# Patient Record
Sex: Female | Born: 2000 | Race: White | Hispanic: No
Health system: Southern US, Community
[De-identification: ages and names within clinical notes are randomized; demographics above are authoritative.]

## PROBLEM LIST (undated history)

## (undated) DIAGNOSIS — F419 Anxiety disorder, unspecified: Secondary | ICD-10-CM

## (undated) DIAGNOSIS — R519 Headache, unspecified: Secondary | ICD-10-CM

## (undated) DIAGNOSIS — M419 Scoliosis, unspecified: Secondary | ICD-10-CM

## (undated) DIAGNOSIS — IMO0001 Reserved for inherently not codable concepts without codable children: Secondary | ICD-10-CM

## (undated) DIAGNOSIS — F32A Depression, unspecified: Secondary | ICD-10-CM

## (undated) DIAGNOSIS — E039 Hypothyroidism, unspecified: Secondary | ICD-10-CM

## (undated) HISTORY — PX: OTHER SURGICAL HISTORY: SHX169

---
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
## 2021-01-01 ENCOUNTER — Telehealth: Payer: Self-pay | Admitting: Family Medicine

## 2021-01-01 LAB — URINE CYTOLOGY ANCILLARY ONLY
Chlamydia: NEGATIVE
Comment: NEGATIVE
Comment: NORMAL
Neisseria Gonorrhea: NEGATIVE

## 2021-01-01 NOTE — Telephone Encounter (Signed)
Refer to lab results.  

## 2021-01-01 NOTE — Telephone Encounter (Signed)
Pt calling back about test results. Please call back.

## 2021-01-02 ENCOUNTER — Other Ambulatory Visit: Payer: Self-pay

## 2021-01-02 ENCOUNTER — Encounter: Payer: Self-pay | Admitting: Nurse Practitioner

## 2021-01-02 ENCOUNTER — Ambulatory Visit (INDEPENDENT_AMBULATORY_CARE_PROVIDER_SITE_OTHER): Payer: Medicaid Other | Admitting: Nurse Practitioner

## 2021-01-02 VITALS — BP 117/74 | HR 88 | Temp 97.9°F | Resp 20 | Ht 65.0 in | Wt 153.0 lb

## 2021-01-02 DIAGNOSIS — T2057XA Corrosion of first degree of neck, initial encounter: Secondary | ICD-10-CM | POA: Diagnosis not present

## 2021-01-02 DIAGNOSIS — R21 Rash and other nonspecific skin eruption: Secondary | ICD-10-CM | POA: Diagnosis not present

## 2021-01-02 MED ORDER — METHYLPREDNISOLONE ACETATE 80 MG/ML IJ SUSP
80.0000 mg | Freq: Once | INTRAMUSCULAR | Status: AC
  Administered 2021-01-02: 80 mg via INTRAMUSCULAR

## 2021-01-02 NOTE — Progress Notes (Signed)
   Subjective:    Patient ID: Jennifer Shaw, female    DOB: [birth year], 20 y.o.   MRN: [id number]   Chief Complaint: ? chemical burn to neck and face (Cleaning at work last night and neck got very red)   HPI Patient is in today c/o chemical burn. 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 She touched her neck while cleaning. Woke up in the middle of night with rash  on neck form the cleaning liquid. It burns and itches. She put neosporin which did not help. Trying  not to touch it.    Review of Systems  Constitutional: Negative.   Respiratory: Negative.    Cardiovascular: Negative.   Genitourinary: Negative.   Skin:  Positive for rash.      Objective:   Physical Exam Vitals and nursing note reviewed.  Constitutional:      Appearance: Normal appearance.  Cardiovascular:     Rate and Rhythm: Normal rate and regular rhythm.     Heart sounds: Normal heart sounds.  Pulmonary:     Effort: Pulmonary effort is normal.     Breath sounds: Normal breath sounds.  Skin:    General: Skin is warm.     Findings: Rash (erytherymatous  rash n neck and lower jaw line.) present.  Neurological:     Mental Status: She is alert.    BP 117/74   Pulse 88   Temp 97.9 F (36.6 C) (Temporal)   Resp 20   Ht 5\' 5"  (1.651 m)   Wt 153 lb (69.4 kg)   SpO2 100%   BMI 25.46 kg/m        Assessment & Plan:  Jennifer Shaw in today with chief complaint of ? chemical burn to neck and face (Cleaning at work last night and neck got very red)   1. Superficial chemical burn of neck, initial encounter Avoid rubbing area Cool compresses RTO prn - methylPREDNISolone acetate (DEPO-MEDROL) injection 80 mg    The above assessment and management plan was discussed with the patient. The patient verbalized understanding of and has agreed to the management plan. Patient is aware to call the clinic if symptoms persist or worsen. Patient is aware when to return to the clinic for a follow-up visit. Patient  educated on when it is appropriate to go to the emergency department.   Mary-Margaret , FNP

## 2021-01-02 NOTE — Patient Instructions (Signed)
Chemical Burn, Adult A chemical burn is an injury to the skin. The structures below the skin can also be injured, such as the lungs and other internal organs. This type of burn is caused by coming into contact with a chemical that can damage and kill tissue (caustic chemical). Common caustic chemicals are found in fertilizers, household cleaners, and drain cleaners. A chemical burn can be more serious than other burns. Some chemicals continue to cause damage even after they have been removed from the skin. What are the causes? This condition is caused by swallowing, breathing in, touching, or being touched by a caustic chemical. What increases the risk? You are more likely to get a chemical burn if you work in a place where chemicals are made, stored, or used. These include working in [employer], educational, [place], farming, and mining. What are the signs or symptoms? Symptoms of this condition depend on the type of chemical that caused the burn and the way the chemical came in contact with the body. Symptoms may continue to get worse even after the chemical has been removed. Common symptoms include: Color changes of the skin. Your skin may lose color (blanch), turn red, or turn darker. Blistered skin. Rash. Dry, flaky skin. A type of acne (chloracne) that results from exposure to certain chemicals. Burning or aching pain. Itching. If the chemical is breathed in, or inhaled, symptoms include eye or nose irritation, sore throat, or coughing. If the chemical is swallowed (ingested) or absorbed into the body through a wound, it can damage: Organs, including the liver, kidneys, and bladder. The immune system, the nervous system, or the nose, throat, windpipe, and lungs (respiratory system). Later symptoms may include scarring, shrinking of the skin, and permanent change in skin color. How is this diagnosed? This condition is diagnosed with a medical history and physical exam. Your health care provider  will check how deep the burn is and how much of your skin surface it covers. You may be diagnosed with a: First-degree burn, if the burn only affects the outer layer of skin (epidermis). Second-degree burn, if the burn extends into the second layer of skin (dermis). Third-degree burn, if the burn extends through the dermis and into deeper tissue (hypodermis). Your blood pressure, heart rate, and urine output may also be measured. If the injury is severe, you may also have: Blood tests. A test to check the heart's electrical activity (electrocardiogram, or ECG). A chest X-ray. How is this treated? This condition may be treated by removing the caustic chemical. The skin will be washed or brushed to remove the chemical. Clothes will be removed if they have the chemical on them. After the chemical is removed, you may receive: Oxygen to help you breathe. Antibiotic medicine to fight infection. Pain medicine. Fluids through an IV. Bandages (dressings). A procedure to remove dead tissue (debridement). A tetanus shot. Long-term burn care may include: Breathing support. You may be given oxygen using a machine (ventilator). Frequent wound dressing changes. Antibiotics. Surgery, including debridement, skin graft, or repairing of damaged tissue or structures. Physical therapy. Follow these instructions at home: Medicines Take and apply over-the-counter and prescription medicines only as told by your health care provider. If you were prescribed an antibiotic medicine, take or apply it as told by your health care provider. Do not stop using the antibiotic even if you start to feel better. Burn care Follow instructions from your health care provider about how to take care of your burn, including: How to clean your  burn. When and how you should remove or change your dressing. Check your burn every day for signs of infection. Check for: More redness, swelling, or pain. Fluid or blood. Warmth. Pus  or a bad smell. Keep the dressing dry until your health care provider says it can be removed. Do not take baths, swim, use a hot tub, or do anything that would put your burn underwater until your health care provider approves. Ask your health care provider if you may take showers. You may only be allowed to take sponge baths. Do not put ice on your burn. This can cause more damage. Activity Rest as told by your health care provider. Do not exercise until your health care provider approves. Do range-of-motion movements, if told by your health care provider. General instructions Raise (elevate) the injured area above the level of your heart while you are sitting or lying down. Do not scratch or pick at the burn. Do not break any blisters you may have. Do not peel any skin. Protect your burn from the sun. Drink enough fluid to keep your urine pale yellow. Do not put butter, oil, or other home remedies on your burn. This can lead to an infection. Do not use any products that contain nicotine or tobacco, such as cigarettes, e-cigarettes, and chewing tobacco. These can delay healing. If you need help quitting, ask your health care provider. Keep all follow-up visits as told by your health care provider. This is important. How is this prevented? Avoid exposure to caustic chemicals. Wear protective gloves and equipment when you handle caustic chemicals. Make sure all caustic chemicals are labeled. Talk to your employer about the caustic chemicals that they use. Ask whether those can be replaced with chemicals that are less harmful. Make sure there is proper airflow (ventilation) in any area with caustic chemicals. Keep your skin clean and moisturized. Dry skin is more likely to be damaged by chemicals. Contact a health care provider if: You received a tetanus shot and you have any of the following symptoms at the injection site: Swelling. Severe pain. Redness. Bleeding. Your symptoms do not  improve with treatment. Your pain is not controlled with medicine. You have more redness, swelling, or pain around your burn. Your burn feels warm to the touch. Get help right away if you: Develop any signs of infection such as: Red streaks near the burn. Fluid, blood, or pus coming from the burn. A bad smell coming from your burn. Develop severe swelling. Develop severe pain. Have a fever. Have numbness or tingling in the burned area or farther down your legs or arms. Have trouble breathing, or you develop coughing or noisy breathing (wheezing). Have chest pain. Summary A chemical burn is an injury to the skin that is caused by a caustic chemical. Some chemicals continue to cause damage even after they have been removed from the skin. This condition is more likely to develop in people who are exposed to chemicals at work. Avoid exposure to caustic chemicals that can cause burns. Wear protective gloves and equipment when you handle dangerous chemicals. This information is not intended to replace advice given to you by your health care provider. Make sure you discuss any questions you have with your health care provider. Document Revised: 07/14/2018 Document Reviewed: 07/14/2018 Elsevier Patient Education  2022 [employer].

## 2021-01-09 DIAGNOSIS — F419 Anxiety disorder, unspecified: Secondary | ICD-10-CM | POA: Diagnosis not present

## 2021-01-13 DIAGNOSIS — K529 Noninfective gastroenteritis and colitis, unspecified: Secondary | ICD-10-CM | POA: Diagnosis not present

## 2021-01-20 ENCOUNTER — Other Ambulatory Visit: Payer: Self-pay | Admitting: Family Medicine

## 2021-01-22 ENCOUNTER — Institutional Professional Consult (permissible substitution): Payer: Medicaid Other | Admitting: Plastic Surgery

## 2021-01-25 ENCOUNTER — Encounter: Payer: Self-pay | Admitting: Plastic Surgery

## 2021-01-25 ENCOUNTER — Ambulatory Visit (INDEPENDENT_AMBULATORY_CARE_PROVIDER_SITE_OTHER): Payer: Medicaid Other | Admitting: Plastic Surgery

## 2021-01-25 ENCOUNTER — Other Ambulatory Visit: Payer: Self-pay

## 2021-01-25 VITALS — BP 116/81 | HR 74 | Ht 65.0 in | Wt 150.2 lb

## 2021-01-25 DIAGNOSIS — M546 Pain in thoracic spine: Secondary | ICD-10-CM | POA: Diagnosis not present

## 2021-01-25 DIAGNOSIS — F330 Major depressive disorder, recurrent, mild: Secondary | ICD-10-CM | POA: Diagnosis not present

## 2021-01-25 DIAGNOSIS — N62 Hypertrophy of breast: Secondary | ICD-10-CM

## 2021-01-25 DIAGNOSIS — G8929 Other chronic pain: Secondary | ICD-10-CM | POA: Diagnosis not present

## 2021-01-27 NOTE — Progress Notes (Signed)
° °  Referring Provider Gabriel Earing, FNP [address] [place],  [place] [address]   CC:  Breast hypertrophy and back pain   Jennifer Shaw is an 20 y.o. female.  HPI: 20 year old who is interested in breast reduction.  She is a DD,  She desires B cup.  She has back and shoulder paine and uses butt paste for her intermammary fold rashes.  She has not had mammograms due to age.   No Known Allergies  Outpatient Encounter Medications as of 01/25/2021  Medication Sig   desvenlafaxine (PRISTIQ) 50 MG 24 hr tablet Take 1 tablet by mouth daily.   fluticasone (FLONASE) 50 MCG/ACT nasal spray Place 2 sprays into both nostrils daily.   hydrOXYzine (ATARAX/VISTARIL) 25 MG tablet Take 1-2 tablets 3x a day as needed for anxiety.   levothyroxine (SYNTHROID) 75 MCG tablet TAKE 1 TABLET BY MOUTH EVERY DAY   No facility-administered encounter medications on file as of 01/25/2021.     Past Medical History:  Diagnosis Date   Dysplasia    Scoliosis     Past Surgical History:  Procedure Laterality Date   reconstructive surgery      Family History  Problem Relation Age of Onset   Depression Mother    Alcohol abuse Father    Depression Sister    ADD / ADHD Maternal Grandmother    Anxiety disorder Maternal Grandmother    Cancer Maternal Grandmother        breast cancer   Depression Maternal Grandmother    Alcohol abuse Maternal Grandfather    Cancer Maternal Grandfather        lung cancer   COPD Maternal Grandfather    Alcohol abuse Paternal Grandmother    Alcohol abuse Paternal Grandfather     Social History   Social History Narrative   Not on file     Review of Systems General: Denies fevers, chills, weight loss CV: Denies chest pain, shortness of breath, palpitations   Physical Exam Vitals with BMI 01/25/2021 01/02/2021 12/29/2020  Height 5\' 5"  5\' 5"  5' 5.75"  Weight 150 lbs 3 oz 153 lbs 156 lbs  BMI 24.99 25.46 25.37  Systolic 116 117  Diastolic 81 74 73  Pulse 74  88 83    General:  No acute distress,  Alert and oriented, Non-Toxic, Normal speech and affect Breast:   SN to nipple 28 right, 27 left BW 13 Bil Nipple to fold 9 bilateral Significant breast ptosis  Assessment/Plan Excellent candidate for 200-300 gram breast reduction.  She may have benefit with rashes since she has significant excess skin.    Time based coding: 17 minutes were spent with the patient.  Greater than 50% was spent on counseling cordination of care.  We discussed risks and benefits of breast reduction.   01/27/2021, 8:08 PM

## 2021-01-31 ENCOUNTER — Telehealth: Payer: Self-pay | Admitting: Plastic Surgery

## 2021-01-31 NOTE — Telephone Encounter (Signed)
Called Jennifer Shaw to discuss records for surgery authorization. Jennifer Shaw does not have any documentation of skin rashes/irritation related to shoulder and said that she can only wear sports bras due to the weight of her breasts. Pt would like for me to submit current documentation to insurance for surgery authorization and also place referral for Physical therapy should that be necessary for the authorization. Referral placed at [facility] in [place] and authorization prepped for submission today. Jennifer Shaw understands authorization responses can take 14-30 days and that I will reach out to her as soon as I know.

## 2021-02-03 ENCOUNTER — Emergency Department (HOSPITAL_COMMUNITY)
Admission: EM | Admit: 2021-02-03 | Discharge: 2021-02-03 | Discharge status: Against Medical Advice | Payer: Medicaid Other | Attending: Emergency Medicine | Admitting: Emergency Medicine

## 2021-02-03 ENCOUNTER — Other Ambulatory Visit: Payer: Self-pay

## 2021-02-03 ENCOUNTER — Encounter (HOSPITAL_COMMUNITY): Payer: Self-pay | Admitting: *Deleted

## 2021-02-03 DIAGNOSIS — R42 Dizziness and giddiness: Secondary | ICD-10-CM | POA: Diagnosis not present

## 2021-02-03 DIAGNOSIS — R55 Syncope and collapse: Secondary | ICD-10-CM | POA: Insufficient documentation

## 2021-02-03 DIAGNOSIS — Z5321 Procedure and treatment not carried out due to patient leaving prior to being seen by health care provider: Secondary | ICD-10-CM | POA: Insufficient documentation

## 2021-02-03 DIAGNOSIS — Z5329 Procedure and treatment not carried out because of patient's decision for other reasons: Secondary | ICD-10-CM

## 2021-02-03 DIAGNOSIS — Z79899 Other long term (current) drug therapy: Secondary | ICD-10-CM | POA: Insufficient documentation

## 2021-02-03 DIAGNOSIS — E039 Hypothyroidism, unspecified: Secondary | ICD-10-CM | POA: Diagnosis not present

## 2021-02-03 LAB — BASIC METABOLIC PANEL
Anion gap: 9 (ref 5–15)
BUN: 15 mg/dL (ref 6–20)
CO2: 26 mmol/L (ref 22–32)
Calcium: 9.2 mg/dL (ref 8.9–10.3)
Chloride: 103 mmol/L (ref 98–111)
Creatinine, Ser: 0.59 mg/dL (ref 0.44–1.00)
GFR, Estimated: >60 mL/min (ref >60)
Glucose, Bld: 73 mg/dL (ref 70–99)
Potassium: 4.2 mmol/L (ref 3.5–5.1)
Sodium: 138 mmol/L (ref 135–145)

## 2021-02-03 LAB — CBC WITH DIFFERENTIAL/PLATELET
Abs Immature Granulocytes: 0.02 10*3/uL (ref 0.00–0.07)
Basophils Absolute: 0 10*3/uL (ref 0.0–0.1)
Basophils Relative: 1 %
Eosinophils Absolute: 0.1 10*3/uL (ref 0.0–0.5)
Eosinophils Relative: 1 %
HCT: 38.9 % (ref 36.0–46.0)
Hemoglobin: 13.8 g/dL (ref 12.0–15.0)
Immature Granulocytes: 0 %
Lymphocytes Relative: 26 %
Lymphs Abs: 1.6 10*3/uL (ref 0.7–4.0)
MCH: 32.6 pg (ref 26.0–34.0)
MCHC: 35.5 g/dL (ref 30.0–36.0)
MCV: 92 fL (ref 80.0–100.0)
Monocytes Absolute: 0.4 10*3/uL (ref 0.1–1.0)
Monocytes Relative: 7 %
Neutro Abs: 3.9 10*3/uL (ref 1.7–7.7)
Neutrophils Relative %: 65 %
Platelets: 162 10*3/uL (ref 150–400)
RBC: 4.23 MIL/uL (ref 3.87–5.11)
RDW: 12.1 % (ref 11.5–15.5)
WBC: 6 10*3/uL (ref 4.0–10.5)
nRBC: 0 % (ref 0.0–0.2)

## 2021-02-03 NOTE — ED Provider Notes (Signed)
[facility] EMERGENCY DEPARTMENT Provider Note   CSN: [id number] Arrival date & time: 02/03/21  1216     History Chief Complaint  Patient presents with   Near Syncope    Jennifer Shaw is a 20 y.o. female with no significant medical history.  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  Patient states that during the course of her shift she became lightheaded and dizzy and told her boss that she felt like she was going to pass out.  Patient stated that her boss told her to keep working and then the next patient remembers she was on the ground eating a boberry biscuit and drinking orange juice.  Patient denies hitting her head when she passed out.  Patient currently not complaining of any symptoms.  Patient denies lightheadedness, dizziness, chest pain, shortness of breath, recent infections, fevers.  Patient reports this has happened before in the past in a similar occurrence when she did not eat breakfast.   Near Syncope Pertinent negatives include no chest pain, no abdominal pain and no shortness of breath.      Past Medical History:  Diagnosis Date   Dysplasia    Scoliosis     Patient Active Problem List   Diagnosis Date Noted   Depression, major, recurrent, mild (HCC) 11/29/2020   Generalized anxiety disorder 11/29/2020   Acquired hypothyroidism 11/29/2020    Past Surgical History:  Procedure Laterality Date   reconstructive surgery       OB History   No obstetric history on file.     Family History  Problem Relation Age of Onset   Depression Mother    Alcohol abuse Father    Depression Sister    ADD / ADHD Maternal Grandmother    Anxiety disorder Maternal Grandmother    Cancer Maternal Grandmother        breast cancer   Depression Maternal Grandmother    Alcohol abuse Maternal Grandfather    Cancer Maternal Grandfather        lung cancer   COPD Maternal  Grandfather    Alcohol abuse Paternal Grandmother    Alcohol abuse Paternal Grandfather     Social History   Tobacco Use   Smoking status: Never   Smokeless tobacco: Never  Vaping Use   Vaping Use: Every day   Start date: 04/10/2016   Substances: Nicotine  Substance Use Topics   Alcohol use: Not Currently    Comment: history of alcohol abuse-last drink 1 year ago   Drug use: Not Currently    Types: Marijuana    Comment: last used 3-4 years ago    Home Medications Prior to Admission medications   Medication Sig Start Date End Date Taking? Authorizing Provider  desvenlafaxine (PRISTIQ) 50 MG 24 hr tablet Take 1 tablet by mouth daily. 09/22/20   [provider]  fluticasone (FLONASE) 50 MCG/ACT nasal spray Place 2 sprays into both nostrils daily. 12/29/20   Gabriel Earing, FNP  hydrOXYzine (ATARAX/VISTARIL) 25 MG tablet Take 1-2 tablets 3x a day as needed for anxiety. 09/22/20   Gabriel Earing, FNP  levothyroxine (SYNTHROID) 75 MCG tablet TAKE 1 TABLET BY MOUTH EVERY DAY 01/22/21   Gabriel Earing, FNP    Allergies    Patient has no known allergies.  Review of Systems   Review of Systems  Constitutional:  Negative for fever.  Respiratory:  Negative  for shortness of breath.   Cardiovascular:  Positive for near-syncope. Negative for chest pain.  Gastrointestinal:  Negative for abdominal pain, diarrhea, nausea and vomiting.  Neurological:  Negative for dizziness, weakness and light-headedness.  All other systems reviewed and are negative.  Physical Exam Updated Vital Signs BP 124/74 (BP Location: Right Arm)    Pulse 75    Temp 97.9 F (36.6 C) (Oral)    Resp 16    Ht 5\' 5"  (1.651 m)    Wt 68 kg    LMP 02/02/2021    SpO2 98%    BMI 24.96 kg/m   Physical Exam Vitals and nursing note reviewed.  Constitutional:      General: She is not in acute distress.    Appearance: She is not toxic-appearing.  HENT:     Head: Normocephalic and atraumatic.     Nose: Nose  normal.     Mouth/Throat:     Mouth: Mucous membranes are moist.  Eyes:     Extraocular Movements: Extraocular movements intact.     Pupils: Pupils are equal, round, and reactive to light.  Cardiovascular:     Rate and Rhythm: Normal rate and regular rhythm.  Pulmonary:     Effort: Pulmonary effort is normal.     Breath sounds: Normal breath sounds. No wheezing.  Abdominal:     General: Abdomen is flat.     Palpations: Abdomen is soft.     Tenderness: There is no abdominal tenderness.  Musculoskeletal:        General: Normal range of motion.     Cervical back: Normal range of motion.  Skin:    General: Skin is warm and dry.     Capillary Refill: Capillary refill takes less than 2 seconds.  Neurological:     General: No focal deficit present.     Mental Status: She is alert and oriented to person, place, and time.     GCS: GCS eye subscore is 4. GCS verbal subscore is 5. GCS motor subscore is 6.     Cranial Nerves: Cranial nerves 2-12 are intact.     Sensory: Sensation is intact.     Motor: Motor function is intact. No weakness or pronator drift.     Coordination: Coordination is intact. Coordination normal. Finger-Nose-Finger Test and Heel to [facility] Test normal.    ED Results / Procedures / Treatments   Labs (all labs ordered are listed, but only abnormal results are displayed) Labs Reviewed  BASIC METABOLIC PANEL  CBC WITH DIFFERENTIAL/PLATELET    EKG None  Radiology No results found.  Procedures Procedures   Medications Ordered in ED Medications - No data to display  ED Course  I have reviewed the triage vital signs and the nursing notes.  Pertinent labs & imaging results that were available during my care of the patient were reviewed by me and considered in my medical decision making (see chart for details).    MDM Rules/Calculators/A&P                          20 year old female with no past medical history presents due to syncopal episode at her job.   Patient reports not eating breakfast this morning.  On examination, patient completely alert and oriented complaining of no symptoms at this time.  Will assess using EKG, CBC, BMP  Patient BMP results in normal limits Patient EKG sinus rhythm CBC has not resulted  I went to discuss  the results of her tests with this but she was not in the room.  Upon further investigation, the nurse told me that the patient had left AGAINST MEDICAL ADVICE.  I was still waiting the result of patient CBC.  Patient left AMA.  Final Clinical Impression(s) / ED Diagnoses Final diagnoses:  Near syncope  Left against medical advice    Rx / DC Orders ED Discharge Orders     None        Clent Ridges 02/03/21 1355    Bethann Berkshire, MD 02/04/21 [phone]

## 2021-02-03 NOTE — ED Notes (Signed)
Pt came to nurses station and states she is leaving

## 2021-02-03 NOTE — ED Triage Notes (Signed)
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

## 2021-02-08 ENCOUNTER — Other Ambulatory Visit: Payer: Self-pay

## 2021-02-08 ENCOUNTER — Ambulatory Visit: Payer: Medicaid Other | Attending: Plastic Surgery

## 2021-02-08 DIAGNOSIS — M546 Pain in thoracic spine: Secondary | ICD-10-CM | POA: Insufficient documentation

## 2021-02-08 DIAGNOSIS — M542 Cervicalgia: Secondary | ICD-10-CM | POA: Insufficient documentation

## 2021-02-08 DIAGNOSIS — R293 Abnormal posture: Secondary | ICD-10-CM | POA: Diagnosis not present

## 2021-02-08 DIAGNOSIS — M6281 Muscle weakness (generalized): Secondary | ICD-10-CM | POA: Diagnosis not present

## 2021-02-08 NOTE — Therapy (Signed)
[facility] [address] [place], [place], [address] Phone: [phone]   Fax:  [phone]  Physical Therapy Evaluation  Patient Details  Name: Jennifer Shaw MRN: [id number] Date of Birth: 07-10-2000 Referring Provider (PT): Luppens   Encounter Date: 02/08/2021   PT End of Session - 02/08/21 1305     Visit Number 1    Number of Visits 12    Date for PT Re-Evaluation 05/11/21    PT Start Time 1306    PT Stop Time 1346    PT Time Calculation (min) 40 min    Activity Tolerance Patient tolerated treatment well    Behavior During Therapy [facility] for tasks assessed/performed             Past Medical History:  Diagnosis Date   Dysplasia    Scoliosis     Past Surgical History:  Procedure Laterality Date   reconstructive surgery      There were no vitals filed for this visit.    Subjective Assessment - 02/08/21 1303     Subjective Patient reports that her neck and shoulders start hurting which then causes her back to start hurting. She attributes this to her breast size. She notes that this has been bothering her since she was around 20 years old. She was told to try physical therapy by her referring physician prior to surgical intervention. She has fallen twice in the past few months. She notes that she will lose feeling in her legs and then she gets dizzy. Then the next thing she knows she ends up in the hospital. Her most recent fall was on 02/03/21. However, she notes that this has been going on for almost a year. She notes that she is unable to wear a bra due to the pain and it causes a "wound to open up on her shoulders." She notes that her pain has been so bad at times that she has to call out from work due to the pain and being unable to stand upright. She has noticed that when she pops her back that her pain becomes unbearable.    Pertinent History multiple falls, scoliosis    Limitations Sitting    How long can you sit  comfortably? about 30 minutes    Patient Stated Goals improve her posture and reduced pain    Currently in Pain? Yes    Pain Score 7     Pain Location Back    Pain Orientation Mid;Upper    Pain Descriptors / Indicators Aching    Pain Type Chronic pain    Pain Radiating Towards neck into throracic spine    Pain Onset More than a month ago    Pain Frequency Constant    Aggravating Factors  sitting upright, standing    Pain Relieving Factors laying down, supporting her breasts    Effect of Pain on Daily Activities it can limit her ability to work and complete her school work                [facility] PT Assessment - 02/08/21 0001       Assessment   Medical Diagnosis Hypertrophy of breast;  Pain in throracic spine; other chronic pain    Referring Provider (PT) Luppens    Onset Date/Surgical Date --   about 8 years ago   Next MD Visit unsure    Prior Therapy No      Precautions   Precautions Fall      Restrictions  Weight Bearing Restrictions No      Balance Screen   Has the patient fallen in the past 6 months Yes    How many times? 2    Has the patient had a decrease in activity level because of a fear of falling?  No    Is the patient reluctant to leave their home because of a fear of falling?  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),    Leisure None reported      Cognition   Overall Cognitive Status Within Functional Limits for tasks assessed    Attention Focused    Focused Attention Appears intact    Memory Appears intact    Awareness Appears intact      Observation/Other Assessments   Observations Patient was unable to sit upright more than 10 minutes during her subjective prior to exhibiting increased trunk flexion    Other Surveys  Neck  Disability Index    Neck Disability Index  46% limited      Sensation   Additional Comments Patient reports no numbness or tingling      Posture/Postural Control   Posture/Postural Control Postural limitations    Postural Limitations Rounded Shoulders;Forward head;Increased thoracic kyphosis    Posture Comments Pain with overpressure for scapular depression      ROM / Strength   AROM / PROM / Strength Strength;AROM      AROM   AROM Assessment Site Thoracic;Cervical    Cervical Flexion 40    Cervical Extension 43    Cervical - Right Side Bend WFL    Cervical - Left Side Bend WFL    Cervical - Right Rotation 72    Cervical - Left Rotation 80    Thoracic Flexion WFL    Thoracic Extension to neutral   no throacic extension   Thoracic - Right Side Bend WFL    Thoracic - Left Side Bend [facility]    Thoracic - Right Rotation [facility]    Thoracic - Left Rotation [facility]      Strength   Strength Assessment Site Shoulder    Right/Left Shoulder Right;Left    Right Shoulder Flexion 4-/5    Right Shoulder ABduction 4-/5    Left Shoulder Flexion 4-/5    Left Shoulder ABduction 4-/5      Palpation   Spinal mobility Unable to assess joint mobility due to familiar pain   T7-T8: produces tingling in the area, pain throughout cervical and thoracic spine, C4-7: significantly reproduce familiar pain and shooting pain into the right arm   Palpation comment TTP: bilateral lower trapezius, upper trapezius, infrapinatus, suboccipitals, and cervical paraspinals      Special Tests    Special Tests Cervical    Other special tests Anterior shear: negative    Cervical Tests Dictraction;Spurling's;other      Spurling's   Findings Negative    Side Left    Comment Negative to the right      Distraction Test   Findngs Negative      other    Findings Negative    Comment Sharp Purser                        Objective measurements completed on  examination: See above findings.                      PT Long Term Goals - 02/08/21 1637       PT LONG TERM GOAL #1   Title Patient will be independent with her HEP.    Time 6    Period Weeks    Status New    Target Date 03/22/21      PT LONG TERM GOAL #2   Title Patient will be score no greater than 35% on the NDI for improved ability to complete functional activities.    Time 6    Period Weeks    Status New    Target Date 03/22/21      PT LONG TERM GOAL #3   Title Patient will report no greater than 5/10 cervical and thoracic spine pain with her schoolwork.    Time 6    Period Weeks    Status New    Target Date 03/22/21      PT LONG TERM GOAL #4   Title Patient will report being able to sit upright for at least 45 minutes without being limited by her familiar back pain for improved ability to maintain her attention during class.    Time 6    Period Weeks    Status New    Target Date 03/22/21                    Plan - 02/08/21 1647     Clinical Impression Statement Patient is a 20 year old female presenting to physical therapy with cervical and thoracic spine pain which she attributes to her breast size. She presented to treatment today with moderate to high pain severity and irritability. She also exhibited significant postural deviations including forward head, rounded shoulders, and thoracic kyphosis that increased as she sat during her evaulation. She also notes that she has fallen multiple times in the past six months with no known cause. However, she reported that she has an appointment with her physician on 02/09/21 to determine if it is related to her blood sugar. She exhibitied significant tenderness to palpation and pain with palpation to her cervical and thoracic musculature. Her joint mobility was unable to be assessed due to pain with this assessment. Recommend that she continue with her plan of care to address her impairments to maximize her functional mobility.     Personal Factors and Comorbidities Other;Comorbidity 1;Comorbidity 2;Time since onset of injury/illness/exacerbation;Comorbidity 3+    Comorbidities Depression, anxiety, scoliosis    Examination-Activity Limitations Bathing;Sit;Carry;Dressing;Stand;Lift    Examination-Participation Restrictions Occupation;Cleaning;Community Activity;School;Driving;Shop    Stability/Clinical Decision Making Evolving/Moderate complexity    Clinical Decision Making Moderate    Rehab Potential Fair    PT Frequency 2x / week    PT Duration 6 weeks    PT Treatment/Interventions Neuromuscular re-education;Therapeutic exercise;Therapeutic activities;Patient/family education;Manual techniques;Passive range of motion;Taping    PT Next Visit Plan postural strengthening and reeducation and with manual therapy to cervical and thoracic spine    Recommended Other Services Patient may benefit from a potential referral to a psychiatrist to evaluate the potential impact of her mental health on her symptoms, follow with her primary care physician due to her multiple falls    Consulted and Agree with Plan of Care Patient             Patient will benefit from skilled therapeutic intervention in order to improve the following deficits and  impairments:  Decreased range of motion, Pain, Decreased activity tolerance, Improper body mechanics, Postural dysfunction, Decreased strength  Visit Diagnosis: Pain in thoracic spine  Cervicalgia  Abnormal posture  Muscle weakness (generalized)     Problem List Patient Active Problem List   Diagnosis Date Noted   Depression, major, recurrent, mild (HCC) 11/29/2020   Generalized anxiety disorder 11/29/2020   Acquired hypothyroidism 11/29/2020    Granville Lewis, PT 02/08/2021, 5:13 PM  [facility] [address] [place], [place], [address] Phone: [phone]   Fax:  [phone]  Name: Jennifer Shaw MRN: [id number] Date of Birth:  [birth year]

## 2021-02-09 ENCOUNTER — Encounter: Payer: Self-pay | Admitting: Family Medicine

## 2021-02-09 ENCOUNTER — Encounter: Payer: Self-pay | Admitting: Family

## 2021-02-09 ENCOUNTER — Ambulatory Visit (INDEPENDENT_AMBULATORY_CARE_PROVIDER_SITE_OTHER): Payer: Medicaid Other | Admitting: Family

## 2021-02-09 VITALS — BP 110/74 | HR 70 | Temp 98.1°F | Ht 65.0 in | Wt 148.0 lb

## 2021-02-09 DIAGNOSIS — Z09 Encounter for follow-up examination after completed treatment for conditions other than malignant neoplasm: Secondary | ICD-10-CM

## 2021-02-09 DIAGNOSIS — R55 Syncope and collapse: Secondary | ICD-10-CM | POA: Diagnosis not present

## 2021-02-09 LAB — BAYER DCA HB A1C WAIVED: HB A1C (BAYER DCA - WAIVED): 4.6 % — ABNORMAL LOW (ref 4.8–5.6)

## 2021-02-09 NOTE — Progress Notes (Signed)
Subjective:    Patient ID: Jennifer Shaw, female    DOB: 2000-04-03, 20 y.o.   MRN: [id number]  Chief Complaint  Patient presents with   Loss of Consciousness    Started a year ago has been to hospital    Pt presents to the office today to discuss syncope episodes. She passed out on 02/03/21 and went to the ED. She had not ate breakfast and it was thought this had played a role in her syncope episode. She had a stable CBC, BMP, and EKG.   She reports this has happened 3+ times. She states she gets sweaty, hot, and dizzy and then "falls out". She states this has happened before and if she drinks orange juice or eat something sweet she feels better and will not "pass out".  Loss of Consciousness This is a recurrent problem. The symptoms are aggravated by standing. Associated symptoms include diaphoresis, dizziness, light-headedness, nausea, palpitations, vomiting and weakness. Pertinent negatives include no bladder incontinence, bowel incontinence, clumsiness, slurred speech or visual change. The treatment provided mild relief.     Review of Systems  Constitutional:  Positive for diaphoresis.  Cardiovascular:  Positive for palpitations and syncope.  Gastrointestinal:  Positive for nausea and vomiting. Negative for bowel incontinence.  Genitourinary:  Negative for bladder incontinence.  Neurological:  Positive for dizziness, weakness and light-headedness.  All other systems reviewed and are negative.     Objective:   Physical Exam Vitals reviewed.  Constitutional:      General: She is not in acute distress.    Appearance: She is well-developed.  HENT:     Head: Normocephalic and atraumatic.     Right Ear: Tympanic membrane normal.     Left Ear: Tympanic membrane normal.  Eyes:     Pupils: Pupils are equal, round, and reactive to light.  Neck:     Thyroid: No thyromegaly.  Cardiovascular:     Rate and Rhythm: Normal rate and regular rhythm.     Heart sounds: Normal heart  sounds. No murmur heard. Pulmonary:     Effort: Pulmonary effort is normal. No respiratory distress.     Breath sounds: Normal breath sounds. No wheezing.  Abdominal:     General: Bowel sounds are normal. There is no distension.     Palpations: Abdomen is soft.     Tenderness: There is no abdominal tenderness.  Musculoskeletal:        General: No tenderness. Normal range of motion.     Cervical back: Normal range of motion and neck supple.  Skin:    General: Skin is warm and dry.  Neurological:     Mental Status: She is alert and oriented to person, place, and time.     Cranial Nerves: No cranial nerve deficit.     Deep Tendon Reflexes: Reflexes are normal and symmetric.  Psychiatric:        Behavior: Behavior normal.        Thought Content: Thought content normal.        Judgment: Judgment normal.     BP 110/74    Pulse 70    Temp 98.1 F (36.7 C) (Temporal)    Ht 5\' 5"  (1.651 m)    Wt 148 lb (67.1 kg)    LMP 02/02/2021    BMI 24.63 kg/m       Assessment & Plan:  Jennifer Shaw comes in today with chief complaint of Loss of Consciousness (Started a year ago has been to hospital )  Diagnosis and orders addressed:  1. Syncope, unspecified syncope type - Bayer DCA Hb A1c Waived - Ambulatory referral to Cardiology  2. Hospital discharge follow-up   Labs pending Hospital notes reviewed Encouraged to eat healthy diet Follow up if symptoms worsen or do not improve    Jannifer Rodney, FNP

## 2021-02-09 NOTE — Patient Instructions (Signed)
Syncope, Adult °Syncope refers to a condition in which a person temporarily loses consciousness. Syncope may also be called fainting or passing out. It is caused by a sudden decrease in blood flow to the brain. This can happen for a variety of reasons. °Most causes of syncope are not dangerous. It can be triggered by things such as needle sticks, seeing blood, pain, or intense emotion. However, syncope can also be a sign of a serious medical problem, such as a heart abnormality. Other causes can include dehydration, migraines, or taking medicines that lower blood pressure. Your health care provider may do tests to find the reason why you are having syncope. °If you faint, get medical help right away. Call your local emergency services (911 in the [place] °Follow these instructions at home: °Pay attention to any changes in your symptoms. Take these actions to stay safe and to help relieve your symptoms: °Knowing when you may be about to faint °Signs that you may be about to faint include: °Feeling dizzy, weak, light-headed, or like the room is spinning. °Feeling nauseous. °Seeing spots or seeing all white or all black in your field of vision. °Having cold, clammy skin or feeling warm and sweaty. °Hearing ringing in the ears (tinnitus). °If you start to feel like you might faint, sit or lie down right away. If sitting, put your head down between your legs. If lying down, raise (elevate) your feet above the level of your heart. °Breathe deeply and steadily. Wait until all the symptoms have passed. °Have someone stay with you until you feel stable. °Medicines °Take over-the-counter and prescription medicines only as told by your health care provider. °If you are taking blood pressure or heart medicine, get up slowly and take several minutes to sit and then stand. This can reduce dizziness and decrease the risk of syncope. °Lifestyle °Do not drive, use machinery, or play sports until your health care provider says it is  okay. °Do not drink alcohol. °Do not use any products that contain nicotine or tobacco. These products include cigarettes, chewing tobacco, and vaping devices, such as e-cigarettes. If you need help quitting, ask your health care provider. °Avoid hot tubs and saunas. °General instructions °Talk with your health care provider about your symptoms. You may need to have testing to understand the cause of your syncope. °Drink enough fluid to keep your urine pale yellow. °Avoid prolonged standing. If you must stand for a long time, do movements such as: °Moving your legs. °Crossing your legs. °Flexing and stretching your leg muscles. °Squatting. °Keep all follow-up visits. This is important. °Contact a health care provider if: °You have episodes of near fainting. °Get help right away if: °You faint. °You hit your head or are injured after fainting. °You have any of these symptoms that may indicate trouble with your heart: °Fast or irregular heartbeats (palpitations). °Unusual pain in your chest, abdomen, or back. °Shortness of breath. °You have a seizure. °You have a severe headache. °You are confused. °You have vision problems. °You have severe weakness or trouble walking. °You are bleeding from your mouth or rectum, or you have black or tarry stool. °These symptoms may represent a serious problem that is an emergency. Do not wait to see if your symptoms will go away. Get medical help right away. Call your local emergency services (911 in the [place] Do not drive yourself to the hospital. °Summary °Syncope refers to a condition in which a person temporarily loses consciousness. Syncope may also be called fainting   or passing out. It is caused by a sudden decrease in blood flow to the brain. °Signs that you may be about to faint include dizziness, feeling light-headed, feeling nauseous, sudden vision changes, or cold, clammy skin. °Even though most causes of syncope are not dangerous, syncope can be a sign of a serious  medical problem. Get help right away if you faint. °If you start to feel like you might faint, sit or lie down right away. If sitting, put your head down between your legs. If lying down, raise (elevate) your feet above the level of your heart. °This information is not intended to replace advice given to you by your health care provider. Make sure you discuss any questions you have with your health care provider. °Document Revised: 06/08/2020 Document Reviewed: 06/08/2020 °Elsevier Patient Education © 2022 [employer] ° °

## 2021-02-27 ENCOUNTER — Ambulatory Visit: Payer: Medicaid Other | Admitting: Family Medicine

## 2021-02-27 ENCOUNTER — Encounter: Payer: Self-pay | Admitting: Family Medicine

## 2021-03-02 ENCOUNTER — Encounter: Payer: Self-pay | Admitting: Physical Therapy

## 2021-03-02 ENCOUNTER — Other Ambulatory Visit: Payer: Self-pay

## 2021-03-02 ENCOUNTER — Ambulatory Visit: Payer: Medicaid Other | Attending: Plastic Surgery | Admitting: Physical Therapy

## 2021-03-02 DIAGNOSIS — R293 Abnormal posture: Secondary | ICD-10-CM

## 2021-03-02 DIAGNOSIS — M6281 Muscle weakness (generalized): Secondary | ICD-10-CM | POA: Diagnosis not present

## 2021-03-02 DIAGNOSIS — M546 Pain in thoracic spine: Secondary | ICD-10-CM

## 2021-03-02 DIAGNOSIS — M542 Cervicalgia: Secondary | ICD-10-CM

## 2021-03-02 NOTE — Therapy (Signed)
[facility] [address] [place], [place], [address] Phone: [phone]   Fax:  [phone]  Physical Therapy Treatment  Patient Details  Name: Jennifer Shaw MRN: [id number] Date of Birth: 29-Dec-2000 Referring Provider (PT): Luppens   Encounter Date: 03/02/2021   PT End of Session - 03/02/21 1002     Visit Number 2    Number of Visits 12    Date for PT Re-Evaluation 05/11/21    PT Start Time 0949    PT Stop Time 1029    PT Time Calculation (min) 40 min    Activity Tolerance Patient tolerated treatment well    Behavior During Therapy [facility] for tasks assessed/performed             Past Medical History:  Diagnosis Date   Dysplasia    Scoliosis     Past Surgical History:  Procedure Laterality Date   reconstructive surgery      There were no vitals filed for this visit.   Subjective Assessment - 03/02/21 0958     Subjective Reports low level back pain upon arrival. Reduction is scheduled 03/2021.    Pertinent History multiple falls, scoliosis    Limitations Sitting    How long can you sit comfortably? about 30 minutes    Patient Stated Goals improve her posture and reduced pain    Currently in Pain? Yes    Pain Score 3     Pain Location Back    Pain Orientation Mid;Upper    Pain Descriptors / Indicators Aching    Pain Type Chronic pain    Pain Onset More than a month ago    Pain Frequency Constant                [facility] PT Assessment - 03/02/21 0001       Assessment   Medical Diagnosis Hypertrophy of breast;  Pain in throracic spine; other chronic pain    Referring Provider (PT) Domenica Reamer                           [facility] Adult PT Treatment/Exercise - 03/02/21 0001       Exercises   Exercises Shoulder      Shoulder Exercises: Supine   Flexion AROM;Both;20 reps    ABduction AROM;Both;20 reps    Diagonals AROM;Both;20 reps      Shoulder Exercises: Standing   Horizontal ABduction  Strengthening;Both;15 reps;Theraband    Theraband Level (Shoulder Horizontal ABduction) Level 2 (Red)    External Rotation Strengthening;Both;5 reps;Theraband    Theraband Level (Shoulder External Rotation) Level 2 (Red)    External Rotation Limitations reported pain    Extension Strengthening;Both;20 reps;10 reps;Theraband    Theraband Level (Shoulder Extension) Level 2 (Red)    Row Strengthening;Both;20 reps;10 reps;Theraband    Theraband Level (Shoulder Row) Level 2 (Red)      Shoulder Exercises: ROM/Strengthening   UBE (Upper Arm Bike) 60 RPM x6 min    Wall Pushups 20 reps      Manual Therapy   Manual Therapy Soft tissue mobilization    Soft tissue mobilization STW to B UT in SL to reduce tightness and pain                          PT Long Term Goals - 02/08/21 1637       PT LONG TERM GOAL #1   Title Patient will be independent with  her HEP.    Time 6    Period Weeks    Status New    Target Date 03/22/21      PT LONG TERM GOAL #2   Title Patient will be score no greater than 35% on the NDI for improved ability to complete functional activities.    Time 6    Period Weeks    Status New    Target Date 03/22/21      PT LONG TERM GOAL #3   Title Patient will report no greater than 5/10 cervical and thoracic spine pain with her schoolwork.    Time 6    Period Weeks    Status New    Target Date 03/22/21      PT LONG TERM GOAL #4   Title Patient will report being able to sit upright for at least 45 minutes without being limited by her familiar back pain for improved ability to maintain her attention during class.    Time 6    Period Weeks    Status New    Target Date 03/22/21                   Plan - 03/02/21 1040     Clinical Impression Statement Patient presented in clinic with reports of mid and upper back pain. Breast reduction surgery has been scheduled for 03/2021. Patient progressed into light postural strengthening which she was  limited in somewhat due to pain especially ER. Patient began reporting more discomfort with standing exercises and then transitioned to supine to reduce gravity. Patient also reported UT pain and pain into her neck from the weight of her breasts. Increased muscle tightness palpable in B UT and scalenes palpable.    Personal Factors and Comorbidities Other;Comorbidity 1;Comorbidity 2;Time since onset of injury/illness/exacerbation;Comorbidity 3+    Comorbidities Depression, anxiety, scoliosis    Examination-Activity Limitations Bathing;Sit;Carry;Dressing;Stand;Lift    Examination-Participation Restrictions Occupation;Cleaning;Community Activity;School;Driving;Shop    Stability/Clinical Decision Making Evolving/Moderate complexity    Rehab Potential Fair    PT Frequency 2x / week    PT Duration 6 weeks    PT Treatment/Interventions Neuromuscular re-education;Therapeutic exercise;Therapeutic activities;Patient/family education;Manual techniques;Passive range of motion;Taping    PT Next Visit Plan postural strengthening and reeducation and with manual therapy to cervical and thoracic spine    Consulted and Agree with Plan of Care Patient             Patient will benefit from skilled therapeutic intervention in order to improve the following deficits and impairments:  Decreased range of motion, Pain, Decreased activity tolerance, Improper body mechanics, Postural dysfunction, Decreased strength  Visit Diagnosis: Pain in thoracic spine  Cervicalgia  Abnormal posture  Muscle weakness (generalized)     Problem List Patient Active Problem List   Diagnosis Date Noted   Depression, major, recurrent, mild (HCC) 11/29/2020   Generalized anxiety disorder 11/29/2020   Acquired hypothyroidism 11/29/2020    Marvell Fuller, PTA 03/02/2021, 11:15 AM  [facility] [address] [place], [place], [address] Phone: [phone]   Fax:   [phone]  Name: Jennifer Shaw MRN: [id number] Date of Birth: [birth year]

## 2021-03-07 DIAGNOSIS — J029 Acute pharyngitis, unspecified: Secondary | ICD-10-CM | POA: Diagnosis not present

## 2021-03-07 DIAGNOSIS — J020 Streptococcal pharyngitis: Secondary | ICD-10-CM | POA: Diagnosis not present

## 2021-03-07 DIAGNOSIS — R110 Nausea: Secondary | ICD-10-CM | POA: Diagnosis not present

## 2021-03-07 DIAGNOSIS — G43001 Migraine without aura, not intractable, with status migrainosus: Secondary | ICD-10-CM | POA: Diagnosis not present

## 2021-03-07 DIAGNOSIS — R1110 Vomiting, unspecified: Secondary | ICD-10-CM | POA: Diagnosis not present

## 2021-03-07 DIAGNOSIS — K648 Other hemorrhoids: Secondary | ICD-10-CM | POA: Diagnosis not present

## 2021-03-09 ENCOUNTER — Other Ambulatory Visit: Payer: Self-pay

## 2021-03-09 ENCOUNTER — Ambulatory Visit: Payer: Medicaid Other

## 2021-03-09 DIAGNOSIS — M542 Cervicalgia: Secondary | ICD-10-CM | POA: Diagnosis not present

## 2021-03-09 DIAGNOSIS — R293 Abnormal posture: Secondary | ICD-10-CM

## 2021-03-09 DIAGNOSIS — M6281 Muscle weakness (generalized): Secondary | ICD-10-CM | POA: Diagnosis not present

## 2021-03-09 DIAGNOSIS — M546 Pain in thoracic spine: Secondary | ICD-10-CM

## 2021-03-09 NOTE — Therapy (Signed)
[facility] [address] [place], [place], [address] Phone: [phone]   Fax:  [phone]  Physical Therapy Treatment  Patient Details  Name: Jennifer Shaw MRN: [id number] Date of Birth: August 31, 2000 Referring Provider (PT): Luppens   Encounter Date: 03/09/2021   PT End of Session - 03/09/21 1048     Visit Number 3    Number of Visits 12    Date for PT Re-Evaluation 05/11/21    PT Start Time 1044    PT Stop Time 1115    PT Time Calculation (min) 31 min    Activity Tolerance Patient tolerated treatment well    Behavior During Therapy [facility] for tasks assessed/performed             Past Medical History:  Diagnosis Date   Dysplasia    Scoliosis     Past Surgical History:  Procedure Laterality Date   reconstructive surgery      There were no vitals filed for this visit.   Subjective Assessment - 03/09/21 1047     Subjective Pt arrives for today's treatment session reporting 8/10 low back pain.    Pertinent History multiple falls, scoliosis    Limitations Sitting    How long can you sit comfortably? about 30 minutes    Patient Stated Goals improve her posture and reduced pain    Currently in Pain? Yes    Pain Score 8     Pain Location Back    Pain Onset More than a month ago                               [facility] Adult PT Treatment/Exercise - 03/09/21 0001       Shoulder Exercises: ROM/Strengthening   UBE (Upper Arm Bike) 60 RPM x 10 mins      Manual Therapy   Manual Therapy Soft tissue mobilization    Soft tissue mobilization STW/M to lumbar paraspinals to decrease pain and tone                          PT Long Term Goals - 02/08/21 1637       PT LONG TERM GOAL #1   Title Patient will be independent with her HEP.    Time 6    Period Weeks    Status New    Target Date 03/22/21      PT LONG TERM GOAL #2   Title Patient will be score no greater than 35% on the NDI for  improved ability to complete functional activities.    Time 6    Period Weeks    Status New    Target Date 03/22/21      PT LONG TERM GOAL #3   Title Patient will report no greater than 5/10 cervical and thoracic spine pain with her schoolwork.    Time 6    Period Weeks    Status New    Target Date 03/22/21      PT LONG TERM GOAL #4   Title Patient will report being able to sit upright for at least 45 minutes without being limited by her familiar back pain for improved ability to maintain her attention during class.    Time 6    Period Weeks    Status New    Target Date 03/22/21  Plan - 03/09/21 1048     Clinical Impression Statement Pt arrives for today's treatment session reporting 8/10 low and cervical back pain.  Pt states that she felt something pull while in weight training class earlier this week.  Pt able to tolerate increased time on UBE without increase in pain.  STW/M performed to lumbar and cervical paraspinals with pt positioned in prone to decrease pain and tone.  Pt reported 4/10 low back pain upon completion of today's treatment session.    Personal Factors and Comorbidities Other;Comorbidity 1;Comorbidity 2;Time since onset of injury/illness/exacerbation;Comorbidity 3+    Comorbidities Depression, anxiety, scoliosis    Examination-Activity Limitations Bathing;Sit;Carry;Dressing;Stand;Lift    Examination-Participation Restrictions Occupation;Cleaning;Community Activity;School;Driving;Shop    Stability/Clinical Decision Making Evolving/Moderate complexity    Rehab Potential Fair    PT Frequency 2x / week    PT Duration 6 weeks    PT Treatment/Interventions Neuromuscular re-education;Therapeutic exercise;Therapeutic activities;Patient/family education;Manual techniques;Passive range of motion;Taping    PT Next Visit Plan postural strengthening and reeducation and with manual therapy to cervical and thoracic spine    Consulted and Agree with  Plan of Care Patient             Patient will benefit from skilled therapeutic intervention in order to improve the following deficits and impairments:  Decreased range of motion, Pain, Decreased activity tolerance, Improper body mechanics, Postural dysfunction, Decreased strength  Visit Diagnosis: Pain in thoracic spine  Cervicalgia  Abnormal posture  Muscle weakness (generalized)     Problem List Patient Active Problem List   Diagnosis Date Noted   Depression, major, recurrent, mild (HCC) 11/29/2020   Generalized anxiety disorder 11/29/2020   Acquired hypothyroidism 11/29/2020    Newman Pies, PTA 03/09/2021, 11:24 AM  [facility] [address] [place], [place], [address] Phone: [phone]   Fax:  [phone]  Name: Jennifer Shaw MRN: [id number] Date of Birth: 2000/10/10

## 2021-03-12 ENCOUNTER — Ambulatory Visit (INDEPENDENT_AMBULATORY_CARE_PROVIDER_SITE_OTHER): Payer: Medicaid Other | Admitting: Plastic Surgery

## 2021-03-12 ENCOUNTER — Other Ambulatory Visit: Payer: Self-pay

## 2021-03-12 DIAGNOSIS — M546 Pain in thoracic spine: Secondary | ICD-10-CM

## 2021-03-12 DIAGNOSIS — G8929 Other chronic pain: Secondary | ICD-10-CM

## 2021-03-12 DIAGNOSIS — N62 Hypertrophy of breast: Secondary | ICD-10-CM

## 2021-03-13 ENCOUNTER — Encounter: Payer: Self-pay | Admitting: Family

## 2021-03-13 ENCOUNTER — Ambulatory Visit (INDEPENDENT_AMBULATORY_CARE_PROVIDER_SITE_OTHER): Payer: Medicaid Other

## 2021-03-13 ENCOUNTER — Ambulatory Visit (INDEPENDENT_AMBULATORY_CARE_PROVIDER_SITE_OTHER): Payer: Medicaid Other | Admitting: Family

## 2021-03-13 ENCOUNTER — Encounter: Payer: Self-pay | Admitting: Plastic Surgery

## 2021-03-13 VITALS — BP 120/80 | HR 98 | Temp 98.2°F | Ht 65.0 in | Wt 159.8 lb

## 2021-03-13 DIAGNOSIS — M79645 Pain in left finger(s): Secondary | ICD-10-CM

## 2021-03-13 DIAGNOSIS — S60052A Contusion of left little finger without damage to nail, initial encounter: Secondary | ICD-10-CM

## 2021-03-13 MED ORDER — ONDANSETRON 4 MG PO TBDP: 4.0000 mg | ORAL_TABLET | Freq: Three times a day (TID) | ORAL | 0 refills | Status: DC | PRN

## 2021-03-13 MED ORDER — IBUPROFEN 600 MG PO TABS: 600.0000 mg | ORAL_TABLET | Freq: Three times a day (TID) | ORAL | 0 refills | Status: DC | PRN

## 2021-03-13 MED ORDER — OXYCODONE HCL 5 MG PO TABS
5.0000 mg | ORAL_TABLET | ORAL | 0 refills | Status: DC | PRN
Start: 2021-03-13 — End: 2021-03-16

## 2021-03-13 NOTE — Progress Notes (Signed)
° °  Subjective:    Patient ID: Jennifer Shaw, female    DOB: 07-May-2000, 21 y.o.   MRN: [id number]  Chief Complaint  Patient presents with   Hand Pain    Left hand small finger    Hand Pain   She reports her sister was closing her car door this morning and got her left  pinky finger was hit. She reports her pain is 7 out 10. She has not taken anything for it, but has applied ice with mild relief. She can move the proximal joint, but states she can not move the distal. Reports mild swelling.    Review of Systems  All other systems reviewed and are negative.     Objective:   Physical Exam Vitals reviewed.  Constitutional:      General: She is not in acute distress.    Appearance: She is well-developed.  HENT:     Head: Normocephalic and atraumatic.  Eyes:     Pupils: Pupils are equal, round, and reactive to light.  Neck:     Thyroid: No thyromegaly.  Cardiovascular:     Rate and Rhythm: Normal rate and regular rhythm.     Heart sounds: Normal heart sounds. No murmur heard. Pulmonary:     Effort: Pulmonary effort is normal. No respiratory distress.     Breath sounds: Normal breath sounds. No wheezing.  Abdominal:     General: Bowel sounds are normal. There is no distension.     Palpations: Abdomen is soft.     Tenderness: There is no abdominal tenderness.  Musculoskeletal:        General: No tenderness. Normal range of motion.     Cervical back: Normal range of motion and neck supple.  Skin:    General: Skin is warm and dry.  Neurological:     Mental Status: She is alert and oriented to person, place, and time.     Cranial Nerves: No cranial nerve deficit.     Deep Tendon Reflexes: Reflexes are normal and symmetric.  Psychiatric:        Behavior: Behavior normal.        Thought Content: Thought content normal.        Judgment: Judgment normal.   X-ray- Negative, Preliminary reading by Jannifer Rodney, FNP [facility]  BP 120/80    Pulse 98    Temp 98.2 F (36.8 C)  (Temporal)    Ht 5\' 5"  (1.651 m)    Wt 159 lb 12.8 oz (72.5 kg)    BMI 26.59 kg/m      Assessment & Plan:  comes in today with chief complaint of Hand Pain (Left hand small finger)   Diagnosis and orders addressed:  1. Pain of finger of left hand  - DG Finger Little Left; Future  2. Contusion of left little finger without damage to nail, initial encounter    No fracture noted  Motrin as needed Finger splint applied Avoid injury  Jennifer Earing, FNP

## 2021-03-13 NOTE — Patient Instructions (Signed)
Contusion A contusion is a deep bruise. Contusions are the result of a blunt injury to tissues and muscle fibers under the skin. The injury causes bleeding under the skin. The skin overlying the contusion may turn blue, purple, or yellow. Minor injuries will give you a painless contusion, but more severe injuries causecontusions that may stay painful and swollen for a few weeks. Follow these instructions at home: Pay attention to any changes in your symptoms. Let your health care providerknow about them. Take these actions to relieve your pain. Managing pain, stiffness, and swelling  Use resting, icing, applying pressure (compression), and raising (elevating) the injured area. This is often called the RICE strategy. Rest the injured area. Return to your normal activities as told by your health care provider. Ask your health care provider what activities are safe for you. If directed, put ice on the injured area: Put ice in a plastic bag. Place a towel between your skin and the bag. Leave the ice on for 20 minutes, 2-3 times per day. If directed, apply light compression to the injured area using an elastic bandage. Make sure the bandage is not wrapped too tightly. Remove and reapply the bandage as directed by your health care provider. If possible, raise (elevate) the injured area above the level of your heart while you are sitting or lying down.  General instructions Take over-the-counter and prescription medicines only as told by your health care provider. Keep all follow-up visits as told by your health care provider. This is important. Contact a health care provider if: Your symptoms do not improve after several days of treatment. Your symptoms get worse. You have difficulty moving the injured area. Get help right away if: You have severe pain. You have numbness in a hand or foot. Your hand or foot turns pale or cold. Summary A contusion is a deep bruise. Contusions are the result of a  blunt injury to tissues and muscle fibers under the skin. It is treated with rest, ice, compression, and elevation. You may be given over-the-counter medicines for pain. Contact a health care provider if your symptoms do not improve, or get worse. Get help right away if you have severe pain, have numbness, or the area turns pale or cold. This information is not intended to replace advice given to you by your health care provider. Make sure you discuss any questions you have with your healthcare provider. Document Revised: 09/18/2017 Document Reviewed: 09/18/2017 Elsevier Patient Education  2022 [employer]vier Inc[employer]  

## 2021-03-13 NOTE — H&P (View-Only) (Signed)
° °  Referring Provider Gabriel Earing, FNP [address] [place],  [place] [address]   CC: Preop for breast reduction       Jennifer Shaw is an 21 y.o. female.  HPI: Patient is a 21 year old who is preop for breast reduction.  She understands the risks and benefits of surgery and wishes to proceed.  She understands she may have drains.  She has no history of bleeding disorder or clotting.  No Known Allergies  Outpatient Encounter Medications as of 03/12/2021  Medication Sig   desvenlafaxine (PRISTIQ) 50 MG 24 hr tablet Take 1 tablet by mouth daily.   fluticasone (FLONASE) 50 MCG/ACT nasal spray Place 2 sprays into both nostrils daily. (Patient not taking: Reported on 02/08/2021)   hydrOXYzine (ATARAX/VISTARIL) 25 MG tablet Take 1-2 tablets 3x a day as needed for anxiety.   levothyroxine (SYNTHROID) 75 MCG tablet TAKE 1 TABLET BY MOUTH EVERY DAY (Patient not taking: Reported on 02/08/2021)   No facility-administered encounter medications on file as of 03/12/2021.     Past Medical History:  Diagnosis Date   Dysplasia    Scoliosis     Past Surgical History:  Procedure Laterality Date   reconstructive surgery      Family History  Problem Relation Age of Onset   Depression Mother    Alcohol abuse Father    Depression Sister    ADD / ADHD Maternal Grandmother    Anxiety disorder Maternal Grandmother    Cancer Maternal Grandmother        breast cancer   Depression Maternal Grandmother    Alcohol abuse Maternal Grandfather    Cancer Maternal Grandfather        lung cancer   COPD Maternal Grandfather    Alcohol abuse Paternal Grandmother    Alcohol abuse Paternal Grandfather     Social History   Social History Narrative   Not on file     Review of Systems General: Denies fevers, chills, weight loss CV: Denies chest pain, shortness of breath, palpitations   Physical Exam Vitals with BMI 03/13/2021 02/09/2021 02/03/2021  Height 5\' 5"  5\' 5"  5\' 5"   Weight 159 lbs 13  oz 148 lbs 150 lbs  BMI 26.59 24.63 24.96  Systolic 120 110  Diastolic 80 74 74  Pulse 98 70 75    General:  No acute distress,  Alert and oriented, Non-Toxic, Normal speech and affect Heent: [place]/AT Chest:  clear, no wheezing CV: RRR Abd: soft, nt/nd Ext:  no clubbing, cyanosis, or edema  Assessment/Plan Risk and benefits of surgery discussed and the patient wishes to proceed.  03/13/2021, 9:34 PM

## 2021-03-13 NOTE — Progress Notes (Signed)
° °  Referring Provider °Morgan, Tiffany M, FNP °[address] °[place],  [place] [address]  ° °CC: Preop for breast reduction °   ° ° ° °Jennifer Shaw is an 20 y.o. female.  °HPI: Patient is a 20-year-old who is preop for breast reduction.  She understands the risks and benefits of surgery and wishes to proceed.  She understands she may have drains.  She has no history of bleeding disorder or clotting. ° °No Known Allergies ° °Outpatient Encounter Medications as of 03/12/2021  °Medication Sig  ° desvenlafaxine (PRISTIQ) 50 MG 24 hr tablet Take 1 tablet by mouth daily.  ° fluticasone (FLONASE) 50 MCG/ACT nasal spray Place 2 sprays into both nostrils daily. (Patient not taking: Reported on 02/08/2021)  ° hydrOXYzine (ATARAX/VISTARIL) 25 MG tablet Take 1-2 tablets 3x a day as needed for anxiety.  ° levothyroxine (SYNTHROID) 75 MCG tablet TAKE 1 TABLET BY MOUTH EVERY DAY (Patient not taking: Reported on 02/08/2021)  ° °No facility-administered encounter medications on file as of 03/12/2021.  °  ° °Past Medical History:  °Diagnosis Date  ° Dysplasia   ° Scoliosis   ° ° °Past Surgical History:  °Procedure Laterality Date  ° reconstructive surgery    ° ° °Family History  °Problem Relation Age of Onset  ° Depression Mother   ° Alcohol abuse Father   ° Depression Sister   ° ADD / ADHD Maternal Grandmother   ° Anxiety disorder Maternal Grandmother   ° Cancer Maternal Grandmother   °     breast cancer  ° Depression Maternal Grandmother   ° Alcohol abuse Maternal Grandfather   ° Cancer Maternal Grandfather   °     lung cancer  ° COPD Maternal Grandfather   ° Alcohol abuse Paternal Grandmother   ° Alcohol abuse Paternal Grandfather   ° ° °Social History  ° °Social History Narrative  ° Not on file  °  ° °Review of Systems °General: Denies fevers, chills, weight loss °CV: Denies chest pain, shortness of breath, palpitations ° ° °Physical Exam °Vitals with BMI 03/13/2021 02/09/2021 02/03/2021  °Height 5' 5" 5' 5" 5' 5"  °Weight 159 lbs 13  oz 148 lbs 150 lbs  °BMI 26.59 24.63 24.96  °Systolic 120 110 124  °Diastolic 80 74 74  °Pulse 98 70 75  °  °General:  No acute distress,  Alert and oriented, Non-Toxic, Normal speech and affect °Heent: [place]/AT °Chest:  clear, no wheezing °CV: RRR °Abd: soft, nt/nd °Ext:  no clubbing, cyanosis, or edema ° °Assessment/Plan °Risk and benefits of surgery discussed and the patient wishes to proceed. ° °Jennifer Shaw °03/13/2021, 9:34 PM  ° ° °  °

## 2021-03-16 ENCOUNTER — Ambulatory Visit (INDEPENDENT_AMBULATORY_CARE_PROVIDER_SITE_OTHER): Payer: Medicaid Other | Admitting: Family

## 2021-03-16 ENCOUNTER — Encounter: Payer: Medicaid Other | Admitting: Physical Therapy

## 2021-03-16 ENCOUNTER — Encounter: Payer: Self-pay | Admitting: Family

## 2021-03-16 VITALS — BP 109/61 | HR 67 | Temp 98.0°F | Ht 65.0 in | Wt 160.4 lb

## 2021-03-16 DIAGNOSIS — F330 Major depressive disorder, recurrent, mild: Secondary | ICD-10-CM | POA: Diagnosis not present

## 2021-03-16 DIAGNOSIS — E039 Hypothyroidism, unspecified: Secondary | ICD-10-CM

## 2021-03-16 DIAGNOSIS — K5900 Constipation, unspecified: Secondary | ICD-10-CM

## 2021-03-16 DIAGNOSIS — F411 Generalized anxiety disorder: Secondary | ICD-10-CM | POA: Diagnosis not present

## 2021-03-16 MED ORDER — DESVENLAFAXINE SUCCINATE ER 50 MG PO TB24: 50.0000 mg | ORAL_TABLET | Freq: Every day | ORAL | 1 refills | Status: DC

## 2021-03-16 MED ORDER — POLYETHYLENE GLYCOL 3350 17 GM/SCOOP PO POWD: 17.0000 g | Freq: Every day | ORAL | 1 refills | Status: DC

## 2021-03-16 NOTE — Progress Notes (Signed)
Subjective:    Patient ID: Jennifer Shaw, female    DOB: 2000-06-21, 21 y.o.   MRN: [id number]  Chief Complaint  Patient presents with   Medical Management of Chronic Issues   Pt presents to the office today for chronic follow up. Not currently taking any levothyroxine.  Depression        This is a chronic problem.  The current episode started more than 1 year ago.   The onset quality is gradual.   Associated symptoms include fatigue and sad (some times).  Associated symptoms include no helplessness, no hopelessness, not irritable and no restlessness.  Past treatments include SSRIs - Selective serotonin reuptake inhibitors.  Compliance with treatment is good.  Past medical history includes thyroid problem and anxiety.   Anxiety Presents for follow-up visit. Symptoms include depressed mood, excessive worry and nervous/anxious behavior. Patient reports no irritability or restlessness. Symptoms occur occasionally. The severity of symptoms is moderate.    Thyroid Problem Presents for follow-up visit. Symptoms include anxiety, constipation, depressed mood, fatigue and hair loss. The symptoms have been worsening.  Constipation This is a chronic problem. The current episode started more than 1 year ago. The problem has been waxing and waning since onset. Her stool frequency is 1 time per day. She has tried nothing for the symptoms. The treatment provided no relief.     Review of Systems  Constitutional:  Positive for fatigue. Negative for irritability.  Gastrointestinal:  Positive for constipation.  Psychiatric/Behavioral:  Positive for depression. The patient is nervous/anxious.   All other systems reviewed and are negative.     Objective:   Physical Exam Vitals reviewed.  Constitutional:      General: She is not irritable.She is not in acute distress.    Appearance: She is well-developed.  HENT:     Head: Normocephalic and atraumatic.     Right Ear: Tympanic membrane normal.      Left Ear: Tympanic membrane normal.  Eyes:     Pupils: Pupils are equal, round, and reactive to light.  Neck:     Thyroid: No thyromegaly.  Cardiovascular:     Rate and Rhythm: Normal rate and regular rhythm.     Heart sounds: Normal heart sounds. No murmur heard. Pulmonary:     Effort: Pulmonary effort is normal. No respiratory distress.     Breath sounds: Normal breath sounds. No wheezing.  Abdominal:     General: Bowel sounds are normal. There is no distension.     Palpations: Abdomen is soft.     Tenderness: There is no abdominal tenderness.  Musculoskeletal:        General: No tenderness. Normal range of motion.     Cervical back: Normal range of motion and neck supple.  Skin:    General: Skin is warm and dry.  Neurological:     Mental Status: She is alert and oriented to person, place, and time.     Cranial Nerves: No cranial nerve deficit.     Deep Tendon Reflexes: Reflexes are normal and symmetric.  Psychiatric:        Behavior: Behavior normal.        Thought Content: Thought content normal.        Judgment: Judgment normal.      BP 109/61    Pulse 67    Temp 98 F (36.7 C) (Temporal)    Ht _0  (1.651 m)    Wt 160 lb 6.4 oz (72.8 kg)    BMI  26.69 kg/m   Assessment & Plan:  Jennifer Shaw comes in today with chief complaint of Medical Management of Chronic Issues   Diagnosis and orders addressed:  1. Acquired hypothyroidism - CMP14+EGFR - TSH  2. Depression, major, recurrent, mild (HCC) - desvenlafaxine (PRISTIQ) 50 MG 24 hr tablet; Take 1 tablet (50 mg total) by mouth daily.  Dispense: 90 tablet; Refill: 1 - CMP14+EGFR  3. Generalized anxiety disorder - desvenlafaxine (PRISTIQ) 50 MG 24 hr tablet; Take 1 tablet (50 mg total) by mouth daily.  Dispense: 90 tablet; Refill: 1 - CMP14+EGFR  4. Constipation, unspecified constipation type Start Miralax  Force fluids - CMP14+EGFR - polyethylene glycol powder (GLYCOLAX/MIRALAX) 17 GM/SCOOP powder; Take 17 g by  mouth daily.  Dispense: 3350 g; Refill: 1   Labs pending Health Maintenance reviewed Diet and exercise encouraged  Follow up plan: Keep follow up with PCP  Evelina Dun, FNP

## 2021-03-16 NOTE — Patient Instructions (Signed)

## 2021-03-17 LAB — CMP14+EGFR
ALT: 22 IU/L (ref 0–32)
AST: 22 IU/L (ref 0–40)
Albumin/Globulin Ratio: 2.2 (ref 1.2–2.2)
Albumin: 4.8 g/dL (ref 3.9–5.0)
Alkaline Phosphatase: 82 IU/L (ref 42–106)
BUN/Creatinine Ratio: 24 — ABNORMAL HIGH (ref 9–23)
BUN: 17 mg/dL (ref 6–20)
Bilirubin Total: 0.6 mg/dL (ref 0.0–1.2)
CO2: 28 mmol/L (ref 20–29)
Calcium: 9.4 mg/dL (ref 8.7–10.2)
Chloride: 100 mmol/L (ref 96–106)
Creatinine, Ser: 0.72 mg/dL (ref 0.57–1.00)
Globulin, Total: 2.2 g/dL (ref 1.5–4.5)
Glucose: 77 mg/dL (ref 70–99)
Potassium: 4.9 mmol/L (ref 3.5–5.2)
Sodium: 141 mmol/L (ref 134–144)
Total Protein: 7 g/dL (ref 6.0–8.5)
eGFR: 123 mL/min/{1.73_m2} (ref >59)

## 2021-03-17 LAB — TSH: TSH: 3.57 u[IU]/mL (ref 0.450–4.500)

## 2021-03-19 ENCOUNTER — Encounter (HOSPITAL_BASED_OUTPATIENT_CLINIC_OR_DEPARTMENT_OTHER): Payer: Self-pay | Admitting: Plastic Surgery

## 2021-03-19 ENCOUNTER — Other Ambulatory Visit: Payer: Self-pay

## 2021-03-27 NOTE — Progress Notes (Addendum)
Sent message reminding pt to come pick up CHG soap.   Surgical soap given with instructions, pt verbalized understanding.

## 2021-03-28 ENCOUNTER — Ambulatory Visit (HOSPITAL_BASED_OUTPATIENT_CLINIC_OR_DEPARTMENT_OTHER)
Admission: RE | Admit: 2021-03-28 | Discharge: 2021-03-28 | Disposition: A | Discharge status: Home / Self Care | Payer: Medicaid Other | Source: Ambulatory Visit | Attending: Plastic Surgery | Admitting: Plastic Surgery

## 2021-03-28 ENCOUNTER — Encounter (HOSPITAL_BASED_OUTPATIENT_CLINIC_OR_DEPARTMENT_OTHER)
Admission: RE | Disposition: A | Discharge status: Home / Self Care | Payer: Self-pay | Source: Ambulatory Visit | Attending: Plastic Surgery

## 2021-03-28 ENCOUNTER — Other Ambulatory Visit: Payer: Self-pay

## 2021-03-28 ENCOUNTER — Ambulatory Visit (HOSPITAL_BASED_OUTPATIENT_CLINIC_OR_DEPARTMENT_OTHER): Payer: Medicaid Other | Admitting: Anesthesiology

## 2021-03-28 ENCOUNTER — Encounter (HOSPITAL_BASED_OUTPATIENT_CLINIC_OR_DEPARTMENT_OTHER): Payer: Self-pay | Admitting: Plastic Surgery

## 2021-03-28 DIAGNOSIS — G8929 Other chronic pain: Secondary | ICD-10-CM

## 2021-03-28 DIAGNOSIS — F32A Depression, unspecified: Secondary | ICD-10-CM | POA: Diagnosis not present

## 2021-03-28 DIAGNOSIS — F419 Anxiety disorder, unspecified: Secondary | ICD-10-CM | POA: Diagnosis not present

## 2021-03-28 DIAGNOSIS — N62 Hypertrophy of breast: Secondary | ICD-10-CM

## 2021-03-28 DIAGNOSIS — E039 Hypothyroidism, unspecified: Secondary | ICD-10-CM | POA: Insufficient documentation

## 2021-03-28 DIAGNOSIS — M546 Pain in thoracic spine: Secondary | ICD-10-CM

## 2021-03-28 HISTORY — DX: Depression, unspecified: F32.A

## 2021-03-28 HISTORY — PX: BREAST REDUCTION SURGERY: SHX8

## 2021-03-28 HISTORY — DX: Hypothyroidism, unspecified: E03.9

## 2021-03-28 HISTORY — DX: Anxiety disorder, unspecified: F41.9

## 2021-03-28 HISTORY — DX: Headache, unspecified: R51.9

## 2021-03-28 LAB — POCT PREGNANCY, URINE: Preg Test, Ur: NEGATIVE

## 2021-03-28 SURGERY — MAMMOPLASTY, REDUCTION
Anesthesia: General | Site: Breast | Laterality: Bilateral

## 2021-03-28 MED ORDER — FENTANYL CITRATE (PF) 100 MCG/2ML IJ SOLN
25.0000 ug | INTRAMUSCULAR | Status: DC | PRN
  Administered 2021-03-28: 25 ug via INTRAVENOUS
  Administered 2021-03-28: 50 ug via INTRAVENOUS

## 2021-03-28 MED ORDER — LIDOCAINE HCL (CARDIAC) PF 100 MG/5ML IV SOSY
PREFILLED_SYRINGE | INTRAVENOUS | Status: DC | PRN
  Administered 2021-03-28: 100 mg via INTRAVENOUS

## 2021-03-28 MED ORDER — CHLORHEXIDINE GLUCONATE CLOTH 2 % EX PADS
6.0000 | MEDICATED_PAD | Freq: Once | CUTANEOUS | Status: AC
  Administered 2021-03-28: 6 via TOPICAL

## 2021-03-28 MED ORDER — PROPOFOL 10 MG/ML IV BOLUS
INTRAVENOUS | Status: DC | PRN
  Administered 2021-03-28: 200 mg via INTRAVENOUS

## 2021-03-28 MED ORDER — CEFAZOLIN SODIUM-DEXTROSE 2-4 GM/100ML-% IV SOLN
2.0000 g | INTRAVENOUS | Status: AC
  Administered 2021-03-28: 2 g via INTRAVENOUS

## 2021-03-28 MED ORDER — HYDROMORPHONE HCL 1 MG/ML IJ SOLN
INTRAMUSCULAR | Status: DC | PRN
  Administered 2021-03-28 (×2): .5 mg via INTRAVENOUS

## 2021-03-28 MED ORDER — FENTANYL CITRATE (PF) 100 MCG/2ML IJ SOLN
INTRAMUSCULAR | Status: DC | PRN
  Administered 2021-03-28: 100 ug via INTRAVENOUS

## 2021-03-28 MED ORDER — ONDANSETRON HCL 4 MG/2ML IJ SOLN: 4.0000 mg | Freq: Four times a day (QID) | INTRAMUSCULAR | Status: DC | PRN

## 2021-03-28 MED ORDER — MIDAZOLAM HCL 5 MG/5ML IJ SOLN
INTRAMUSCULAR | Status: DC | PRN
  Administered 2021-03-28: 2 mg via INTRAVENOUS

## 2021-03-28 MED ORDER — OXYCODONE HCL 5 MG PO TABS
5.0000 mg | ORAL_TABLET | Freq: Once | ORAL | Status: AC | PRN
  Administered 2021-03-28: 5 mg via ORAL

## 2021-03-28 MED ORDER — DEXAMETHASONE SODIUM PHOSPHATE 4 MG/ML IJ SOLN
INTRAMUSCULAR | Status: DC | PRN
  Administered 2021-03-28: 5 mg via INTRAVENOUS

## 2021-03-28 MED ORDER — OXYCODONE HCL 5 MG/5ML PO SOLN: 5.0000 mg | Freq: Once | ORAL | Status: AC | PRN

## 2021-03-28 MED ORDER — PHENYLEPHRINE 40 MCG/ML (10ML) SYRINGE FOR IV PUSH (FOR BLOOD PRESSURE SUPPORT)
PREFILLED_SYRINGE | INTRAVENOUS | Status: DC | PRN
  Administered 2021-03-28 (×2): 80 ug via INTRAVENOUS

## 2021-03-28 MED ORDER — LACTATED RINGERS IV SOLN: INTRAVENOUS | Status: DC

## 2021-03-28 MED ORDER — LACTATED RINGERS IV SOLN
INTRAVENOUS | Status: DC | PRN
  Administered 2021-03-28: 900 mL

## 2021-03-28 MED ORDER — EPHEDRINE SULFATE (PRESSORS) 50 MG/ML IJ SOLN
INTRAMUSCULAR | Status: DC | PRN
  Administered 2021-03-28: 15 mg via INTRAVENOUS
  Administered 2021-03-28: 10 mg via INTRAVENOUS

## 2021-03-28 MED ORDER — ONDANSETRON HCL 4 MG/2ML IJ SOLN
INTRAMUSCULAR | Status: DC | PRN
  Administered 2021-03-28: 4 mg via INTRAVENOUS

## 2021-03-28 MED ORDER — CHLORHEXIDINE GLUCONATE CLOTH 2 % EX PADS: 6.0000 | MEDICATED_PAD | Freq: Once | CUTANEOUS | Status: DC

## 2021-03-28 SURGICAL SUPPLY — 59 items
BENZOIN TINCTURE PRP APPL 2/3 (GAUZE/BANDAGES/DRESSINGS) ×4 IMPLANT
BLADE SURG 10 STRL SS (BLADE) ×13 IMPLANT
BLADE SURG 15 STRL LF DISP TIS (BLADE) ×2 IMPLANT
BLADE SURG 15 STRL SS (BLADE) ×1
BNDG ELASTIC 6X10 VLCR STRL LF (GAUZE/BANDAGES/DRESSINGS) ×2 IMPLANT
CANISTER SUCT 1200ML W/VALVE (MISCELLANEOUS) ×2 IMPLANT
CHLORAPREP W/TINT 26 (MISCELLANEOUS) ×4 IMPLANT
COVER BACK TABLE 60X90IN (DRAPES) ×2 IMPLANT
COVER MAYO STAND STRL (DRAPES) ×2 IMPLANT
DRAIN CHANNEL 15F RND FF W/TCR (WOUND CARE) ×2 IMPLANT
DRAIN PENROSE .5X12 LATEX STL (DRAIN) IMPLANT
DRAPE LAPAROSCOPIC ABDOMINAL (DRAPES) ×2 IMPLANT
DRAPE UTILITY XL STRL (DRAPES) ×2 IMPLANT
DRSG MEPILEX BORDER 4X8 (GAUZE/BANDAGES/DRESSINGS) ×2 IMPLANT
DRSG PAD ABDOMINAL 8X10 ST (GAUZE/BANDAGES/DRESSINGS) ×8 IMPLANT
ELECT COATED BLADE 2.86 ST (ELECTRODE) ×2 IMPLANT
ELECT REM PT RETURN 9FT ADLT (ELECTROSURGICAL) ×2
ELECTRODE REM PT RTRN 9FT ADLT (ELECTROSURGICAL) ×1 IMPLANT
EVACUATOR SILICONE 100CC (DRAIN) ×2 IMPLANT
GAUZE SPONGE 4X4 12PLY STRL (GAUZE/BANDAGES/DRESSINGS) ×2 IMPLANT
GLOVE SRG 8 PF TXTR STRL LF DI (GLOVE) IMPLANT
GLOVE SURG ENC MOIS LTX SZ7.5 (GLOVE) ×2 IMPLANT
GLOVE SURG ENC TEXT LTX SZ7.5 (GLOVE) ×2 IMPLANT
GLOVE SURG UNDER POLY LF SZ8 (GLOVE)
GOWN STRL REUS W/ TWL LRG LVL3 (GOWN DISPOSABLE) ×3 IMPLANT
GOWN STRL REUS W/TWL LRG LVL3 (GOWN DISPOSABLE) ×3
MARKER SKIN DUAL TIP RULER LAB (MISCELLANEOUS) IMPLANT
NDL FILTER BLUNT 18X1 1/2 (NEEDLE) ×1 IMPLANT
NDL HYPO 25X1 1.5 SAFETY (NEEDLE) IMPLANT
NDL SAFETY ECLIPSE 18X1.5 (NEEDLE) ×1 IMPLANT
NDL SPNL 18GX3.5 QUINCKE PK (NEEDLE) ×1 IMPLANT
NEEDLE FILTER BLUNT 18X 1/2SAF (NEEDLE) ×1
NEEDLE FILTER BLUNT 18X1 1/2 (NEEDLE) ×1 IMPLANT
NEEDLE HYPO 18GX1.5 SHARP (NEEDLE)
NEEDLE HYPO 25X1 1.5 SAFETY (NEEDLE) IMPLANT
NEEDLE SPNL 18GX3.5 QUINCKE PK (NEEDLE) ×2 IMPLANT
NS IRRIG 1000ML POUR BTL (IV SOLUTION) ×2 IMPLANT
PACK BASIN DAY SURGERY FS (CUSTOM PROCEDURE TRAY) ×2 IMPLANT
PENCIL SMOKE EVACUATOR (MISCELLANEOUS) ×2 IMPLANT
SLEEVE SCD COMPRESS KNEE MED (STOCKING) ×2 IMPLANT
SPONGE T-LAP 18X18 ~~LOC~~+RFID (SPONGE) ×5 IMPLANT
STAPLER INSORB 30 2030 C-SECTI (MISCELLANEOUS) ×1 IMPLANT
STAPLER VISISTAT 35W (STAPLE) ×3 IMPLANT
STRIP SUTURE WOUND CLOSURE 1/2 (MISCELLANEOUS) ×7 IMPLANT
SUT MNCRL AB 4-0 PS2 18 (SUTURE) ×10 IMPLANT
SUT PDS 3-0 CT2 (SUTURE) ×6
SUT PDS AB 2-0 CT2 27 (SUTURE) ×2 IMPLANT
SUT PDS II 3-0 CT2 27 ABS (SUTURE) ×3 IMPLANT
SUT SILK 2 0 SH (SUTURE) ×2 IMPLANT
SUT VLOC 180 P-14 24 (SUTURE) ×2 IMPLANT
SYR 50ML LL SCALE MARK (SYRINGE) IMPLANT
SYR BULB IRRIG 60ML STRL (SYRINGE) ×2 IMPLANT
SYR CONTROL 10ML LL (SYRINGE) IMPLANT
TAPE MEASURE VINYL STERILE (MISCELLANEOUS) IMPLANT
TOWEL GREEN STERILE FF (TOWEL DISPOSABLE) ×4 IMPLANT
TUBE CONNECTING 20X1/4 (TUBING) ×2 IMPLANT
TUBING INFILTRATION IT-10001 (TUBING) ×2 IMPLANT
UNDERPAD 30X36 HEAVY ABSORB (UNDERPADS AND DIAPERS) ×4 IMPLANT
YANKAUER SUCT BULB TIP NO VENT (SUCTIONS) ×2 IMPLANT

## 2021-03-28 NOTE — Anesthesia Preprocedure Evaluation (Signed)
Anesthesia Evaluation  Patient identified by MRN, date of birth, ID band Patient awake    Reviewed: Allergy & Precautions, H&P , NPO status , Patient's Chart, lab work & pertinent test results  Airway Mallampati: II   Neck ROM: full    Dental   Pulmonary    breath sounds clear to auscultation       Cardiovascular negative cardio ROS   Rhythm:regular Rate:Normal     Neuro/Psych  Headaches, PSYCHIATRIC DISORDERS Anxiety Depression    GI/Hepatic   Endo/Other  Hypothyroidism   Renal/GU      Musculoskeletal   Abdominal   Peds  Hematology   Anesthesia Other Findings   Reproductive/Obstetrics                             Anesthesia Physical Anesthesia Plan  ASA: 2  Anesthesia Plan: General   Post-op Pain Management:    Induction: Intravenous  PONV Risk Score and Plan: 3 and Ondansetron, Dexamethasone, Midazolam and Treatment may vary due to age or medical condition  Airway Management Planned: LMA  Additional Equipment:   Intra-op Plan:   Post-operative Plan: Extubation in OR  Informed Consent: I have reviewed the patients History and Physical, chart, labs and discussed the procedure including the risks, benefits and alternatives for the proposed anesthesia with the patient or authorized representative who has indicated his/her understanding and acceptance.     Dental advisory given  Plan Discussed with: CRNA, Anesthesiologist and Surgeon  Anesthesia Plan Comments:         Anesthesia Quick Evaluation

## 2021-03-28 NOTE — Transfer of Care (Signed)
Immediate Anesthesia Transfer of Care Note  Patient: Jennifer Shaw  Procedure(s) Performed: MAMMARY REDUCTION  (BREAST) (Bilateral: Breast)  Patient Location: PACU  Anesthesia Type:General  Level of Consciousness: awake, alert , oriented, drowsy and patient cooperative  Airway & Oxygen Therapy: Patient Spontanous Breathing and Patient connected to face mask oxygen  Post-op Assessment: Report given to RN and Post -op Vital signs reviewed and stable  Post vital signs: Reviewed and stable  Last Vitals:  Vitals Value Taken Time  BP    Temp    Pulse    Resp    SpO2      Last Pain:  Vitals:   03/28/21 1125  TempSrc: Oral  PainSc: 0-No pain      Patients Stated Pain Goal: 5 (03/28/21 1125)  Complications: No notable events documented.

## 2021-03-28 NOTE — Op Note (Addendum)
Operative Note   DATE OF OPERATION: 03/28/2021  LOCATION: [facility]   SURGICAL DEPARTMENT: Plastic Surgery  PREOPERATIVE DIAGNOSES: Bilateral symptomatic macromastia.  POSTOPERATIVE DIAGNOSES:  same  PROCEDURE: Bilateral breast reduction with superomedial pedicle.  SURGEON: Lavonia Dana, MD  ASSISTANT: Zadie Cleverly, PA  ANESTHESIA: General.  COMPLICATIONS: None.   INDICATIONS FOR PROCEDURE:  The patient, Jennifer Shaw is a 21 y.o. female born on [birth year], is here for treatment of bilateral symptomatic macromastia. MRN: [id number]  CONSENT:  Informed consent was obtained directly from the patient. Risks, benefits and alternatives were fully discussed. Specific risks including but not limited to bleeding, infection, hematoma, seroma, scarring, pain, infection, contracture, asymmetry, wound healing problems, and need for further surgery were all discussed. The patient did have an ample opportunity to have questions answered to satisfaction.   DESCRIPTION OF PROCEDURE:  The patient was marked preoperatively for a Wise pattern skin excision.  The patient was taken to the operating room. SCDs were placed and antibiotics were given. General anesthesia was administered.The patient's operative site was prepped and draped in a sterile fashion. A time out was performed and all information was confirmed to be correct.  Right Breast: The breast was infiltrated with tumescent solution to help with hemostasis.  The nipple was marked with a 42 mm cookie cutter.  A superomedial pedicle was drawn out with the base of at least 8 cm in size.  A breast tourniquet was then applied and the pedicle was de-epithelialized.  Breast tourniquet was then let down and all incisions were made with a 10 blade.  The pedicle was then isolated down to the chest wall with cautery and the excision was performed removing tissue primarily inferiorly and laterally.  Hemostasis was obtained and the wound  was stapled closed.  Left breast:  The breast was infiltrated with tumescent solution to help with hemostasis.  The nipple was marked with a cookie cutter.  A superomedial pedicle was drawn out with the base of at least 8 cm in size.  A breast tourniquet was then applied and the pedicle was de-epithelialized.  Breast tourniquet was then let down and all incisions were made with a 10 blade.  The pedicle was then isolated down to the chest wall with cautery and the excision was performed removing tissue primarily inferiorly and laterally.  Hemostasis was obtained and the wound was stapled closed.  Patient was then set up to check for size and symmetry.  Minor modifications were made.  This resulted in a total of 227 g removed from the right side and 220 g removed from the left side.  Specimens were sent to pathology separately.  The inframammary incision was closed with a combination of buried in-sorb staples and dermal 3-0 and a running 4-0 monocryl.  The vertical and periareolar limbs were closed with interrupted buried 3-0 PDA and 4-0 Monocryl and a running 4-0 Monocryl suture.  Steri-Strips were then applied along with a soft dressing and Ace wrap.   The PA [facility] was necessary for optimal exposure and retraction throughout the case.  The patient tolerated the procedure well.  There were no complications. The patient was allowed to wake from anesthesia, extubated and taken to the recovery room in satisfactory condition.  I was present for the entire procedure.

## 2021-03-28 NOTE — Interval H&P Note (Signed)
History and Physical Interval Note:  03/28/2021 11:45 AM  Jennifer Shaw  has presented today for surgery, with the diagnosis of Breast Hypertrophy.  The various methods of treatment have been discussed with the patient and family. After consideration of risks, benefits and other options for treatment, the patient has consented to  Procedure(s): MAMMARY REDUCTION  (BREAST) (Bilateral) as a surgical intervention.  The patient's history has been reviewed, patient examined, no change in status, stable for surgery.  I have reviewed the patient's chart and labs.  Questions were answered to the patient's satisfaction.     [name]

## 2021-03-28 NOTE — Anesthesia Procedure Notes (Signed)
Procedure Name: LMA Insertion Date/Time: 03/28/2021 12:16 PM Performed by: Ronnette Hila, CRNA Pre-anesthesia Checklist: Patient identified, Emergency Drugs available, Suction available and Patient being monitored Patient Re-evaluated:Patient Re-evaluated prior to induction Oxygen Delivery Method: Circle system utilized Preoxygenation: Pre-oxygenation with 100% oxygen Induction Type: IV induction Ventilation: Mask ventilation without difficulty LMA: LMA inserted LMA Size: 4.0 Number of attempts: 1 Airway Equipment and Method: Bite block Placement Confirmation: positive ETCO2 Tube secured with: Tape Dental Injury: Teeth and Oropharynx as per pre-operative assessment

## 2021-03-28 NOTE — Discharge Instructions (Addendum)
May leave dressings on for 24-48 hours.  Then may shower.  Replace breast binder and dry guaze.  Leave steri-strips in place.   Post Anesthesia Home Care Instructions  Activity: Get plenty of rest for the remainder of the day. A responsible individual must stay with you for 24 hours following the procedure.  For the next 24 hours, DO NOT: -Drive a car -Advertising copywriter -Drink alcoholic beverages -Take any medication unless instructed by your physician -Make any legal decisions or sign important papers.  Meals: Start with liquid foods such as gelatin or soup. Progress to regular foods as tolerated. Avoid greasy, spicy, heavy foods. If nausea and/or vomiting occur, drink only clear liquids until the nausea and/or vomiting subsides. Call your physician if vomiting continues.  Special Instructions/Symptoms: Your throat may feel dry or sore from the anesthesia or the breathing tube placed in your throat during surgery. If this causes discomfort, gargle with warm salt water. The discomfort should disappear within 24 hours.  If you had a scopolamine patch placed behind your ear for the management of post- operative nausea and/or vomiting:  1. The medication in the patch is effective for 72 hours, after which it should be removed.  Wrap patch in a tissue and discard in the trash. Wash hands thoroughly with soap and water. 2. You may remove the patch earlier than 72 hours if you experience unpleasant side effects which may include dry mouth, dizziness or visual disturbances. 3. Avoid touching the patch. Wash your hands with soap and water after contact with the patch.

## 2021-03-28 NOTE — Brief Op Note (Signed)
03/28/2021  2:38 PM  PATIENT:  Jennifer Shaw  21 y.o. female  PRE-OPERATIVE DIAGNOSIS:  Breast Hypertrophy  POST-OPERATIVE DIAGNOSIS:  Breast Hypertrophy  PROCEDURE:  Procedure(s): MAMMARY REDUCTION  (BREAST) (Bilateral)  SURGEON:  Surgeon(s) and Role:    Lennice Sites, MD - Primary  PHYSICIAN ASSISTANT:   ASSISTANTS: Software engineer, PA   ANESTHESIA:   general  EBL:  35 mL   BLOOD ADMINISTERED:none  DRAINS: none   LOCAL MEDICATIONS USED:  BUPIVICAINE 30 ml in 1 L; 1 amp epi in 1 L, 800 ml injected  SPECIMEN:  right breast: 227 G; left breast 220 G  DISPOSITION OF SPECIMEN:  PATHOLOGY,right and left breast sent separately  COUNTS:  YES  TOURNIQUET:  * No tourniquets in log *  DICTATION: .Dragon Dictation  PLAN OF CARE: Discharge to home after PACU  PATIENT DISPOSITION:  PACU - hemodynamically stable.   Delay start of Pharmacological VTE agent (>24hrs) due to surgical blood loss or risk of bleeding: no

## 2021-03-29 ENCOUNTER — Encounter (HOSPITAL_BASED_OUTPATIENT_CLINIC_OR_DEPARTMENT_OTHER): Payer: Self-pay | Admitting: Plastic Surgery

## 2021-03-29 LAB — SURGICAL PATHOLOGY

## 2021-03-29 NOTE — Anesthesia Postprocedure Evaluation (Signed)
Anesthesia Post Note  Patient: Kynzli Landress  Procedure(s) Performed: MAMMARY REDUCTION  (BREAST) (Bilateral: Breast)     Patient location during evaluation: PACU Anesthesia Type: General Level of consciousness: awake and alert Pain management: pain level controlled Vital Signs Assessment: post-procedure vital signs reviewed and stable Respiratory status: spontaneous breathing, nonlabored ventilation, respiratory function stable and patient connected to nasal cannula oxygen Cardiovascular status: blood pressure returned to baseline and stable Postop Assessment: no apparent nausea or vomiting Anesthetic complications: no   No notable events documented.  Last Vitals:  Vitals:   03/28/21 1600 03/28/21 1602  BP:  123/84  Pulse:  90  Resp: 20 20  Temp:  36.4 C  SpO2: 97% 96%    Last Pain:  Vitals:   03/28/21 1602  TempSrc: Oral  PainSc: 4                  Melanni Benway

## 2021-04-06 ENCOUNTER — Encounter: Payer: Self-pay | Admitting: Plastic Surgery

## 2021-04-06 ENCOUNTER — Ambulatory Visit (INDEPENDENT_AMBULATORY_CARE_PROVIDER_SITE_OTHER): Payer: Medicaid Other | Admitting: Plastic Surgery

## 2021-04-06 ENCOUNTER — Other Ambulatory Visit: Payer: Self-pay

## 2021-04-06 DIAGNOSIS — G8929 Other chronic pain: Secondary | ICD-10-CM

## 2021-04-06 DIAGNOSIS — N62 Hypertrophy of breast: Secondary | ICD-10-CM

## 2021-04-06 DIAGNOSIS — M546 Pain in thoracic spine: Secondary | ICD-10-CM

## 2021-04-06 NOTE — Progress Notes (Signed)
Status post bilateral breast reduction on 03/28/2021.  Doing well without complaints  Physical exam Incisions clean dry and intact, good initial breast shape, good symmetry  Pathology: Benign breast tissue bilaterally  Assessment and plan Patient is doing well and happy with her initial result.  We will see her back in 2 weeks for recheck.

## 2021-04-13 ENCOUNTER — Other Ambulatory Visit: Payer: Self-pay

## 2021-04-13 ENCOUNTER — Encounter: Payer: Self-pay | Admitting: Plastic Surgery

## 2021-04-13 ENCOUNTER — Ambulatory Visit (INDEPENDENT_AMBULATORY_CARE_PROVIDER_SITE_OTHER): Payer: Medicaid Other | Admitting: Plastic Surgery

## 2021-04-13 DIAGNOSIS — N62 Hypertrophy of breast: Secondary | ICD-10-CM

## 2021-04-13 DIAGNOSIS — F330 Major depressive disorder, recurrent, mild: Secondary | ICD-10-CM

## 2021-04-13 NOTE — Progress Notes (Signed)
Status post bilateral breast reduction on 03/28/2021.  She is doing well and very happy with her initial results. ? ?Physical exam ?Incision clean dry and intact, good initial symmetry, no hematoma or seroma ? ?Assessment and plan ?Good initial result from breast reduction. ?We will see the patient back in several weeks for recheck. ?

## 2021-05-04 ENCOUNTER — Other Ambulatory Visit: Payer: Self-pay

## 2021-05-04 ENCOUNTER — Encounter: Payer: Self-pay | Admitting: Plastic Surgery

## 2021-05-04 ENCOUNTER — Ambulatory Visit (INDEPENDENT_AMBULATORY_CARE_PROVIDER_SITE_OTHER): Payer: Medicaid Other | Admitting: Plastic Surgery

## 2021-05-04 DIAGNOSIS — N62 Hypertrophy of breast: Secondary | ICD-10-CM

## 2021-05-04 NOTE — Progress Notes (Signed)
Status post bilateral breast reduction on 03/28/2021.  Patient is doing well. ? ?Physical exam ?Incisions clean dry and intact, 1 or 2 sutures remained. ? ?Assessment and plan ?Remaining end of sutures removed and the patient will follow back in 3 to 4 weeks for recheck and possibly postop photos. ?

## 2021-05-15 DIAGNOSIS — R070 Pain in throat: Secondary | ICD-10-CM | POA: Diagnosis not present

## 2021-05-15 DIAGNOSIS — H10023 Other mucopurulent conjunctivitis, bilateral: Secondary | ICD-10-CM | POA: Diagnosis not present

## 2021-05-15 DIAGNOSIS — H60313 Diffuse otitis externa, bilateral: Secondary | ICD-10-CM | POA: Diagnosis not present

## 2021-05-15 DIAGNOSIS — J329 Chronic sinusitis, unspecified: Secondary | ICD-10-CM | POA: Diagnosis not present

## 2021-06-01 ENCOUNTER — Ambulatory Visit: Payer: Medicaid Other | Admitting: Plastic Surgery

## 2021-06-05 DIAGNOSIS — R300 Dysuria: Secondary | ICD-10-CM | POA: Diagnosis not present

## 2021-06-05 DIAGNOSIS — G44209 Tension-type headache, unspecified, not intractable: Secondary | ICD-10-CM | POA: Diagnosis not present

## 2021-06-05 DIAGNOSIS — N3001 Acute cystitis with hematuria: Secondary | ICD-10-CM | POA: Diagnosis not present

## 2021-06-13 ENCOUNTER — Ambulatory Visit: Payer: Medicaid Other | Admitting: Family Medicine

## 2021-06-18 ENCOUNTER — Ambulatory Visit: Payer: Medicaid Other | Admitting: Family Medicine

## 2021-06-19 ENCOUNTER — Encounter: Payer: Self-pay | Admitting: Family Medicine

## 2021-06-19 ENCOUNTER — Other Ambulatory Visit: Payer: Self-pay | Admitting: Thoracic Surgery (Cardiothoracic Vascular Surgery)

## 2021-06-19 ENCOUNTER — Ambulatory Visit (INDEPENDENT_AMBULATORY_CARE_PROVIDER_SITE_OTHER): Payer: Medicaid Other | Admitting: Family Medicine

## 2021-06-19 VITALS — BP 103/64 | HR 92 | Temp 98.6°F | Ht 65.0 in | Wt 165.4 lb

## 2021-06-19 DIAGNOSIS — R42 Dizziness and giddiness: Secondary | ICD-10-CM | POA: Diagnosis not present

## 2021-06-19 DIAGNOSIS — E039 Hypothyroidism, unspecified: Secondary | ICD-10-CM

## 2021-06-19 DIAGNOSIS — E162 Hypoglycemia, unspecified: Secondary | ICD-10-CM

## 2021-06-19 DIAGNOSIS — F330 Major depressive disorder, recurrent, mild: Secondary | ICD-10-CM | POA: Diagnosis not present

## 2021-06-19 DIAGNOSIS — R55 Syncope and collapse: Secondary | ICD-10-CM | POA: Diagnosis not present

## 2021-06-19 DIAGNOSIS — F411 Generalized anxiety disorder: Secondary | ICD-10-CM | POA: Diagnosis not present

## 2021-06-19 MED ORDER — BLOOD GLUCOSE METER KIT: PACK | 0 refills | Status: DC

## 2021-06-19 MED ORDER — FLUOXETINE HCL 20 MG PO CAPS: 20.0000 mg | ORAL_CAPSULE | Freq: Every day | ORAL | 3 refills | Status: DC

## 2021-06-19 MED ORDER — DESVENLAFAXINE SUCCINATE ER 25 MG PO TB24: 25.0000 mg | ORAL_TABLET | Freq: Every day | ORAL | 0 refills | Status: DC

## 2021-06-19 NOTE — Patient Instructions (Signed)
Take 25 mg of pristiq daily for 2 weeks while starting prozac daily. After 2 weeks, start taking pristiq 25 mg every other day for 1 week, then discontinue.  ?

## 2021-06-19 NOTE — Progress Notes (Signed)
? ?Established Patient Office Visit ? ?Subjective   ?Patient ID: Jennifer Shaw, female    DOB: Sep 16, 2000  Age: 21 y.o. MRN: [id number] ? ?Chief Complaint  ?Patient presents with  ? Medical Management of Chronic Issues  ? Depression  ? ? ?HPI ? ?Syncope ?Repeats syncope once every other week for last few months. She reports suddenly feeling anxious, heat all over, and then she suddenly wakes up on the ground with people around her. This has happened both at home and at work. She did go to the ER once for this. She had a normal EKG and normal BMP. She then left AMA. She was seen by [facility] for follow up and had labs repeated that were unremarkable. She was referred to cardiology but the referral was closed due to inability to contact the patient. She reports that this is occurring more frequently now. Reports LOC that is in and out for up to 30 mins. When this happens at work, they call EMS. Typically she is awake and has eaten by the time EMS gets there and feels back to normal. They check her vitals which have all been fine expect for once when her blood sugar was low. She has refused to go to the ER each time. She does reports feeling dizzy often with changes in position. She denies chest pain, shortness of breath, edema, focal weakness, or palpitations. Denies seizure like activity. She reports that she eats regularly and stays well hydrated.  ? ?2. Depression/anxiety ?She doesn't feel like pristiq is helping much any more. She also reports significant sweating since starting pristiq. She would like to try another medications. She has failed celexa and lexapro in the past.  ? ? ?  06/19/2021  ?  1:13 PM 03/16/2021  ?  2:00 PM 03/13/2021  ? 10:54 AM  ?Depression screen PHQ 2/9  ?Decreased Interest 2 0 0  ?Down, Depressed, Hopeless 1 0 0  ?PHQ - 2 Score 3 0 0  ?Altered sleeping 2 0   ?Tired, decreased energy 3 0   ?Change in appetite 2 2   ?Feeling bad or failure about yourself  0 0   ?Trouble concentrating 0 0   ?Moving  slowly or fidgety/restless 0 0   ?Suicidal thoughts 0 0   ?PHQ-9 Score 10 2   ?Difficult doing work/chores Somewhat difficult Not difficult at all   ? ? ?  06/19/2021  ?  1:14 PM 03/16/2021  ?  2:00 PM 12/29/2020  ? 10:17 AM 11/29/2020  ?  8:27 AM  ?GAD 7 : Generalized Anxiety Score  ?Nervous, Anxious, on Edge 0 0 1 1  ?Control/stop worrying 2 0 1 1  ?Worry too much - different things 2 0 1 0  ?Trouble relaxing 3 0 0 1  ?Restless 0 0 0 0  ?Easily annoyed or irritable 3 0 3 0  ?Afraid - awful might happen 0 0 0 0  ?Total GAD 7 Score 10 0 6 3  ?Anxiety Difficulty Very difficult Not difficult at all Somewhat difficult Somewhat difficult  ? ? ?Past Medical History:  ?Diagnosis Date  ? Anxiety   ? Depression   ? Dysplasia   ? Headache   ? Hypothyroidism   ? RSV infection 12/13/2020  ? Scoliosis   ? ?  ? ?ROS ?Negative unless specially indicated above in HPI. ? ?  ?Objective:  ?  ? ?BP 103/64   Pulse 92   Temp 98.6 ?F (37 ?C) (Temporal)   Ht 5'  5" (1.651 m)   Wt 165 lb 6 oz (75 kg)   BMI 27.52 kg/m?  ?BP Readings from Last 3 Encounters:  ?06/19/21 103/64  ?03/28/21 123/84  ?03/16/21 109/61  ? ?  ? ?Physical Exam ?Vitals and nursing note reviewed.  ?Constitutional:   ?   General: She is not in acute distress. ?   Appearance: She is not ill-appearing, toxic-appearing or diaphoretic.  ?HENT:  ?   Mouth/Throat:  ?   Mouth: Mucous membranes are moist.  ?Eyes:  ?   Extraocular Movements: Extraocular movements intact.  ?   Conjunctiva/sclera: Conjunctivae normal.  ?   Pupils: Pupils are equal, round, and reactive to light.  ?Neck:  ?   Thyroid: No thyroid mass, thyromegaly or thyroid tenderness.  ?   Vascular: No carotid bruit or JVD.  ?Cardiovascular:  ?   Rate and Rhythm: Normal rate and regular rhythm.  ?   Heart sounds: Normal heart sounds. No murmur heard. ?Pulmonary:  ?   Effort: Pulmonary effort is normal. No respiratory distress.  ?   Breath sounds: Normal breath sounds.  ?Abdominal:  ?   General: Bowel sounds are  normal. There is no distension.  ?   Palpations: Abdomen is soft.  ?   Tenderness: There is no abdominal tenderness. There is no guarding or rebound.  ?Musculoskeletal:  ?   Right lower leg: No edema.  ?   Left lower leg: No edema.  ?Skin: ?   General: Skin is warm and dry.  ?Neurological:  ?   General: No focal deficit present.  ?   Mental Status: She is alert and oriented to person, place, and time.  ?   Cranial Nerves: No cranial nerve deficit.  ?   Sensory: No sensory deficit.  ?   Motor: No weakness.  ?   Coordination: Coordination normal.  ?   Gait: Gait normal.  ?Psychiatric:     ?   Mood and Affect: Mood normal.     ?   Behavior: Behavior normal.     ?   Thought Content: Thought content normal.     ?   Judgment: Judgment normal.  ? ? ? ?No results found for any visits on 06/19/21. ? ?Last CBC ?Lab Results  ?Component Value Date  ? WBC 6.0 02/03/2021  ? HGB 13.8 02/03/2021  ? HCT 38.9 02/03/2021  ? MCV 92.0 02/03/2021  ? MCH 32.6 02/03/2021  ? RDW 12.1 02/03/2021  ? PLT 162 02/03/2021  ? ?Last metabolic panel ?Lab Results  ?Component Value Date  ? GLUCOSE 77 03/16/2021  ? NA 141 03/16/2021  ? K 4.9 03/16/2021  ? CL 100 03/16/2021  ? CO2 28 03/16/2021  ? BUN 17 03/16/2021  ? CREATININE 0.72 03/16/2021  ? EGFR 123 03/16/2021  ? CALCIUM 9.4 03/16/2021  ? PROT 7.0 03/16/2021  ? ALBUMIN 4.8 03/16/2021  ? LABGLOB 2.2 03/16/2021  ? AGRATIO 2.2 03/16/2021  ? BILITOT 0.6 03/16/2021  ? ALKPHOS 82 03/16/2021  ? AST 22 03/16/2021  ? ALT 22 03/16/2021  ? ANIONGAP 9 02/03/2021  ? ?Last thyroid functions ?Lab Results  ?Component Value Date  ? TSH 3.570 03/16/2021  ? T4TOTAL 6.4 08/10/2020  ? ?  ? ?The ASCVD Risk score (Arnett DK, et al., 2019) failed to calculate for the following reasons: ?  The 2019 ASCVD risk score is only valid for ages 58 to 82 ? ?  ?Assessment & Plan:  ? ?Jennifer Shaw was seen today for medical  management of chronic issues and depression. ? ?Diagnoses and all orders for this visit: ? ?Depression, major,  recurrent, mild ([place]) ?Generalized anxiety disorder ?Fair controlled. Reports hyperhidrosis due to pristiq. Will wean off pristiq and start prozac. Denies SI.  ?-     FLUoxetine (PROZAC) 20 MG capsule; Take 1 capsule (20 mg total) by mouth daily. ?-     desvenlafaxine (PRISTIQ) 25 MG 24 hr tablet; Take 1 tablet (25 mg total) by mouth daily. ? ?Acquired hypothyroidism ?On synthroid. Labs pending.  ?-     Thyroid Panel With TSH ? ?Syncope, unspecified syncope type ?Dizziness ?Negative orthorstatics today, benign exam. ? Hypoglycemia vs cardiac etiology vs anxiety. Labs pending. New referral to cardiology placed. Ensure regular meals and hydration. Instructed patient to go to ER next time syncope occurs.  ?-     Anemia Profile B ?-     CMP14+EGFR ?-     Thyroid Panel With TSH ?-     Ambulatory referral to Cardiology ? ?Hypoglycemia ?Labs pending. Glucose monitor ordered to check for hypoglycemia as she reports low blood sugar at at least one episode of syncope. Discussed glucose tablets or gel OTC to have on hand if LOC with hypoglycemia.  ?-     Anemia Profile B ?-     CMP14+EGFR ?-     Thyroid Panel With TSH ?-     blood glucose meter kit and supplies; Dispense based on patient and insurance preference. Use up to four times daily as needed for low blood sugar symptoms. ? ? ?Return in about 6 weeks (around 07/31/2021) for chronic follow up. Strict return precautions given.  ? ?The patient indicates understanding of these issues and agrees with the plan. ? ?Gwenlyn Perking, FNP ? ?

## 2021-06-20 ENCOUNTER — Encounter: Payer: Self-pay | Admitting: Family Medicine

## 2021-06-20 LAB — CMP14+EGFR
ALT: 12 IU/L (ref 0–32)
AST: 17 IU/L (ref 0–40)
Albumin/Globulin Ratio: 2.5 — ABNORMAL HIGH (ref 1.2–2.2)
Albumin: 4.8 g/dL (ref 3.9–5.0)
Alkaline Phosphatase: 83 IU/L (ref 42–106)
BUN/Creatinine Ratio: 17 (ref 9–23)
BUN: 12 mg/dL (ref 6–20)
Bilirubin Total: 0.5 mg/dL (ref 0.0–1.2)
CO2: 25 mmol/L (ref 20–29)
Calcium: 9.8 mg/dL (ref 8.7–10.2)
Chloride: 103 mmol/L (ref 96–106)
Creatinine, Ser: 0.72 mg/dL (ref 0.57–1.00)
Globulin, Total: 1.9 g/dL (ref 1.5–4.5)
Glucose: 87 mg/dL (ref 70–99)
Potassium: 4.9 mmol/L (ref 3.5–5.2)
Sodium: 141 mmol/L (ref 134–144)
Total Protein: 6.7 g/dL (ref 6.0–8.5)
eGFR: 123 mL/min/{1.73_m2} (ref >59)

## 2021-06-20 LAB — ANEMIA PROFILE B
Basophils Absolute: 0 10*3/uL (ref 0.0–0.2)
Basos: 0 %
EOS (ABSOLUTE): 0 10*3/uL (ref 0.0–0.4)
Eos: 0 %
Ferritin: 33 ng/mL (ref 15–77)
Folate: 12.5 ng/mL (ref >3.0)
Hematocrit: 40.5 % (ref 34.0–46.6)
Hemoglobin: 14.1 g/dL (ref 11.1–15.9)
Immature Grans (Abs): 0 10*3/uL (ref 0.0–0.1)
Immature Granulocytes: 0 %
Iron Saturation: 43 % (ref 15–55)
Iron: 146 ug/dL (ref 27–159)
Lymphocytes Absolute: 1.4 10*3/uL (ref 0.7–3.1)
Lymphs: 30 %
MCH: 31.9 pg (ref 26.6–33.0)
MCHC: 34.8 g/dL (ref 31.5–35.7)
MCV: 92 fL (ref 79–97)
Monocytes Absolute: 0.4 10*3/uL (ref 0.1–0.9)
Monocytes: 8 %
Neutrophils Absolute: 2.9 10*3/uL (ref 1.4–7.0)
Neutrophils: 62 %
Platelets: 212 10*3/uL (ref 150–450)
RBC: 4.42 x10E6/uL (ref 3.77–5.28)
RDW: 12 % (ref 11.7–15.4)
Retic Ct Pct: 0.9 % (ref 0.6–2.6)
Total Iron Binding Capacity: 339 ug/dL (ref 250–450)
UIBC: 193 ug/dL (ref 131–425)
Vitamin B-12: 285 pg/mL (ref 232–1245)
WBC: 4.8 10*3/uL (ref 3.4–10.8)

## 2021-06-20 LAB — THYROID PANEL WITH TSH
Free Thyroxine Index: 1.8 (ref 1.2–4.9)
T3 Uptake Ratio: 29 % (ref 24–39)
T4, Total: 6.3 ug/dL (ref 4.5–12.0)
TSH: 4.93 u[IU]/mL — ABNORMAL HIGH (ref 0.450–4.500)

## 2021-06-21 NOTE — Progress Notes (Signed)
Patient notified and verbalized understanding. 

## 2021-07-21 DIAGNOSIS — L600 Ingrowing nail: Secondary | ICD-10-CM | POA: Diagnosis not present

## 2021-07-23 ENCOUNTER — Telehealth: Payer: Self-pay

## 2021-07-23 NOTE — Telephone Encounter (Signed)
Transition Care Management Unsuccessful Follow-up Telephone Call  Date of discharge and from where:  07/21/2021 from [facility]  Attempts:  1st Attempt  Reason for unsuccessful TCM follow-up call:  Voice mail full

## 2021-07-24 NOTE — Telephone Encounter (Signed)
Transition Care Management Unsuccessful Follow-up Telephone Call  Date of discharge and from where:  07/21/2021 from [facility]  Attempts:  2nd Attempt  Reason for unsuccessful TCM follow-up call:  Voice mail full

## 2021-07-25 NOTE — Telephone Encounter (Signed)
Transition Care Management Unsuccessful Follow-up Telephone Call  Date of discharge and from where:  07/21/2021 from [facility]  Attempts:  3rd Attempt  Reason for unsuccessful TCM follow-up call:  Unable to reach patient

## 2021-08-01 ENCOUNTER — Ambulatory Visit: Payer: Medicaid Other | Admitting: Family Medicine

## 2021-08-01 ENCOUNTER — Ambulatory Visit (INDEPENDENT_AMBULATORY_CARE_PROVIDER_SITE_OTHER): Payer: Medicaid Other | Admitting: Family Medicine

## 2021-08-01 ENCOUNTER — Encounter: Payer: Self-pay | Admitting: Family Medicine

## 2021-08-01 VITALS — BP 97/58 | HR 87 | Temp 98.7°F | Ht 65.0 in | Wt 165.1 lb

## 2021-08-01 DIAGNOSIS — F330 Major depressive disorder, recurrent, mild: Secondary | ICD-10-CM | POA: Diagnosis not present

## 2021-08-01 DIAGNOSIS — E039 Hypothyroidism, unspecified: Secondary | ICD-10-CM | POA: Diagnosis not present

## 2021-08-01 DIAGNOSIS — F411 Generalized anxiety disorder: Secondary | ICD-10-CM

## 2021-08-01 MED ORDER — LEVOTHYROXINE SODIUM 75 MCG PO TABS: 75.0000 ug | ORAL_TABLET | Freq: Every day | ORAL | 3 refills | Status: DC

## 2021-08-01 NOTE — Progress Notes (Signed)
Established Patient Office Visit  Subjective   Patient ID: Jennifer Shaw, female    DOB: Apr 25, 2000  Age: 21 y.o. MRN: [id number]  Chief Complaint  Patient presents with   Depression   Anxiety    Depression        Past medical history includes anxiety.   Anxiety     Here with a friend today. She reports an increased in anxiety symptoms. She has also been fatigued and reports that her hair has not been growing. She has not been taking levothyroxine for at least 1 months. She isn't sure exactly how long she has not had this. She is taking prozac daily.      08/01/2021    3:34 PM 06/19/2021    1:13 PM 03/16/2021    2:00 PM  Depression screen PHQ 2/9  Decreased Interest 2 2 0  Down, Depressed, Hopeless 0 1 0  PHQ - 2 Score 2 3 0  Altered sleeping 2 2 0  Tired, decreased energy 1 3 0  Change in appetite 0 2 2  Feeling bad or failure about yourself  0 0 0  Trouble concentrating 0 0 0  Moving slowly or fidgety/restless 0 0 0  Suicidal thoughts 0 0 0  PHQ-9 Score 5 10 2   Difficult doing work/chores Somewhat difficult Somewhat difficult Not difficult at all      08/01/2021    3:34 PM 06/19/2021    1:14 PM 03/16/2021    2:00 PM 12/29/2020   10:17 AM  GAD 7 : Generalized Anxiety Score  Nervous, Anxious, on Edge 1 0 0 1  Control/stop worrying 2 2 0 1  Worry too much - different things 2 2 0 1  Trouble relaxing 3 3 0 0  Restless 1 0 0 0  Easily annoyed or irritable 2 3 0 3  Afraid - awful might happen 1 0 0 0  Total GAD 7 Score 12 10 0 6  Anxiety Difficulty Very difficult Very difficult Not difficult at all Somewhat difficult      Past Medical History:  Diagnosis Date   Anxiety    Depression    Dysplasia    Headache    Hypothyroidism    RSV infection 12/13/2020   Scoliosis       Review of Systems  Psychiatric/Behavioral:  Positive for depression.    As per HPI.    Objective:     BP (!) 97/58   Pulse 87   Temp 98.7 F (37.1 C) (Temporal)   Ht 5\' 5"  (1.651  m)   Wt 165 lb 2 oz (74.9 kg)   SpO2 98%   BMI 27.48 kg/m    Physical Exam Vitals and nursing note reviewed.  Constitutional:      General: She is not in acute distress.    Appearance: She is not ill-appearing, toxic-appearing or diaphoretic.  HENT:     Mouth/Throat:     Mouth: Mucous membranes are moist.     Pharynx: Oropharynx is clear.  Neck:     Thyroid: No thyroid mass, thyromegaly or thyroid tenderness.  Cardiovascular:     Rate and Rhythm: Normal rate and regular rhythm.     Heart sounds: Normal heart sounds. No murmur heard. Pulmonary:     Effort: Pulmonary effort is normal. No respiratory distress.     Breath sounds: Normal breath sounds.  Musculoskeletal:     Right lower leg: No edema.     Left lower leg: No edema.  Skin:  General: Skin is warm and dry.  Neurological:     General: No focal deficit present.     Mental Status: She is alert and oriented to person, place, and time.  Psychiatric:        Mood and Affect: Mood normal.        Behavior: Behavior normal.    No results found for any visits on 08/01/21.    The ASCVD Risk score (Arnett DK, et al., 2019) failed to calculate for the following reasons:   The 2019 ASCVD risk score is only valid for ages 65 to 5    Assessment & Plan:   Hollee was seen today for depression and anxiety.  Diagnoses and all orders for this visit:  Acquired hypothyroidism Restart levothyroxine.  -     levothyroxine (SYNTHROID) 75 MCG tablet; Take 1 tablet (75 mcg total) by mouth daily.  Generalized anxiety disorder Depression, major, recurrent, mild ([facility]) Uncontrolled. Denies SI. Discussed will managed thyroid first a she has been off on replaced therapy for at least 4 weeks and reassess.    Return in about 6 weeks (around 09/12/2021) for medication follow up, TSH.   The patient indicates understanding of these issues and agrees with the plan.   Gabriel Earing, FNP

## 2021-08-01 NOTE — Patient Instructions (Signed)

## 2021-08-16 ENCOUNTER — Ambulatory Visit (INDEPENDENT_AMBULATORY_CARE_PROVIDER_SITE_OTHER): Payer: Medicaid Other

## 2021-08-16 ENCOUNTER — Ambulatory Visit (INDEPENDENT_AMBULATORY_CARE_PROVIDER_SITE_OTHER): Payer: Medicaid Other | Admitting: Cardiology

## 2021-08-16 ENCOUNTER — Telehealth: Payer: Self-pay | Admitting: Cardiology

## 2021-08-16 ENCOUNTER — Encounter: Payer: Self-pay | Admitting: Cardiology

## 2021-08-16 ENCOUNTER — Other Ambulatory Visit: Payer: Self-pay | Admitting: Cardiology

## 2021-08-16 VITALS — Ht 67.0 in | Wt 165.4 lb

## 2021-08-16 DIAGNOSIS — R55 Syncope and collapse: Secondary | ICD-10-CM

## 2021-08-16 NOTE — Progress Notes (Signed)
Cardiology Office Note  Date: 08/16/2021   ID: Jennifer Shaw, DOB 14-Sep-2000, MRN [id number]  PCP:  Gabriel Earing, FNP  Cardiologist:  Nona Dell, MD Electrophysiologist:  None   Chief Complaint  Patient presents with   Recurrent syncope    History of Present Illness: Jennifer Shaw is a 21 y.o. female referred for cardiology consultation by Ms. Lequita Halt NP for evaluation of recurrent syncope, I reviewed the office note from May.  She reports a history of recurrent syncope, recalls her first episode at age 41 with a description consistent with neurocardiogenic syncope.  In the last few months however she has had more frequent events, occurring nearly once in 2 weeks, also with less of a warning.  Does tend to occur when she is standing up.  She feels a sense of visual haziness, sometimes rapid heartbeat and weakness, the most recent episode occurred on July 4.  She states that this was witnessed by her parents, attended to by EMS.  She did have some seizure-like activity, but no definite postictal activity.  It takes her several minutes but generally she gets back to baseline fairly quickly.  She tells me that she struggled with obesity, significantly restricted her diet and lost about 120 pounds.  This was in high school, she has subsequently tried to stabilize her weight and eat more regularly.  States that she hydrates regularly.  She is on fluoxetine which was her most recent medication addition.  Also has history of hypothyroidism and is on Synthroid.  I reviewed her most recent lab work which is noted below, TSH normal range.  Orthostatic measurements were normal today.  I personally reviewed her ECG today which is normal.  QT intervals normal, no preexcitation or Brugada pattern.  Past Medical History:  Diagnosis Date   Anxiety    Depression    Dysplasia    Headache    Hypothyroidism    RSV infection 12/13/2020   Scoliosis     Past Surgical History:  Procedure  Laterality Date   BREAST REDUCTION SURGERY Bilateral 03/28/2021   Procedure: MAMMARY REDUCTION  (BREAST);  Surgeon: [name], MD;  Location: [facility];  Service: Plastics;  Laterality: Bilateral;   craniostenosis repair     as infant   reconstructive surgery      Current Outpatient Medications  Medication Sig Dispense Refill   blood glucose meter kit and supplies Dispense based on patient and insurance preference. Use up to four times daily as needed for low blood sugar symptoms. 1 each 0   FLUoxetine (PROZAC) 20 MG capsule Take 1 capsule (20 mg total) by mouth daily. 90 capsule 3   levothyroxine (SYNTHROID) 75 MCG tablet Take 1 tablet (75 mcg total) by mouth daily. 90 tablet 3   desvenlafaxine (PRISTIQ) 25 MG 24 hr tablet Take 1 tablet (25 mg total) by mouth daily. (Patient not taking: Reported on 08/16/2021) 30 tablet 0   No current facility-administered medications for this visit.   Allergies:  Patient has no known allergies.   Social History: The patient  reports that she has never smoked. She has never used smokeless tobacco. She reports that she does not currently use alcohol. She reports that she does not currently use drugs after having used the following drugs: Marijuana.   Family History: The patient's family history includes ADD / ADHD in her maternal grandmother; Alcohol abuse in her father, maternal grandfather, paternal grandfather, and paternal grandmother; Anxiety disorder in her maternal grandmother; COPD  in her maternal grandfather; Cancer in her maternal grandfather and maternal grandmother; Depression in her maternal grandmother, mother, and sister.   ROS: No chest pain, no orthopnea or PND.  Physical Exam: VS:  Ht $R'5\' 7"'Qk$  (1.702 m)   Wt 165 lb 6.4 oz (75 kg)   SpO2 98%   BMI 25.91 kg/m , BMI Body mass index is 25.91 kg/m.  Wt Readings from Last 3 Encounters:  08/16/21 165 lb 6.4 oz (75 kg) (90 %, Z= 1.29)*  08/01/21 165 lb 2 oz (74.9 kg) (90  %, Z= 1.28)*  06/19/21 165 lb 6 oz (75 kg) (90 %, Z= 1.30)*   * Growth percentiles are based on CDC (Girls, 2-20 Years) data.    General: Patient appears comfortable at rest. HEENT: Conjunctiva and lids normal, oropharynx clear. Neck: Supple, no elevated JVP or carotid bruits, no thyromegaly. Lungs: Clear to auscultation, nonlabored breathing at rest. Cardiac: Regular rate and rhythm, no S3 or significant systolic murmur, no pericardial rub. Abdomen: Soft, nontender, bowel sounds present. Extremities: No pitting edema, distal pulses 2+. Skin: Warm and dry. Musculoskeletal: No kyphosis. Neuropsychiatric: Alert and oriented x3, affect grossly appropriate.  ECG:  An ECG dated 02/03/2021 was personally reviewed today and demonstrated:  Sinus rhythm with normal intervals .  Recent Labwork: 06/19/2021: ALT 12; AST 17; BUN 12; Creatinine, Ser 0.72; Hemoglobin 14.1; Platelets 212; Potassium 4.9; Sodium 141; TSH 4.930     Component Value Date/Time   CHOL 126 05/08/2020 1006   TRIG 40 05/08/2020 1006   HDL 49 05/08/2020 1006   CHOLHDL 2.6 05/08/2020 1006   LDLCALC 67 05/08/2020 1006    Other Studies Reviewed Today:  No prior cardiac testing for review today.  Assessment and Plan:  History of recurrent syncope.  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  The more recent episodes have been more frequent and with less warning however.  She was not orthostatic today.  ECG is normal.  TSH normal on Synthroid.  Fluoxetine was most recent medication addition.  We discussed warning signs and symptoms, maintaining adequate hydration, also trying to sit down or lie down if she has any warning that an episode is about to occur.  I do think it would be prudent to screen for any potential arrhythmia in light of the change in character and frequency of her symptoms recently.  We will obtain a 14-day Zio patch.  Also an echocardiogram to ensure structurally  normal heart.  Medication Adjustments/Labs and Tests Ordered: Current medicines are reviewed at length with the patient today.  Concerns regarding medicines are outlined above.   Tests Ordered: Orders Placed This Encounter  Procedures   EKG 12-Lead   ECHOCARDIOGRAM COMPLETE    Medication Changes: No orders of the defined types were placed in this encounter.   Disposition:  Follow up  test results.  Signed, Satira Sark, MD, [facility] 08/16/2021 1:37 PM    [facility] [place], [place], [place] [address] Phone: [phone]; Fax: [phone]

## 2021-08-16 NOTE — Telephone Encounter (Signed)
Pre Cert LONG TERM MONITOR

## 2021-08-16 NOTE — Patient Instructions (Addendum)
Medication Instructions:  Your physician recommends that you continue on your current medications as directed. Please refer to the Current Medication list given to you today.  Labwork: none  Testing/Procedures: Your physician has requested that you have an echocardiogram. Echocardiography is a painless test that uses sound waves to create images of your heart. It provides your doctor with information about the size and shape of your heart and how well your heart's chambers and valves are working. This procedure takes approximately one hour. There are no restrictions for this procedure. ZIO- Long Term Monitor Instructions   Your physician has requested you wear your ZIO AT patch monitor 14 days.   This is a single patch monitor.  [employer] supplies one patch monitor per enrollment.  Additional stickers are not available.   Please do not apply patch if you will be having a Nuclear Stress Test, Echocardiogram, Cardiac CT, MRI, or Chest Xray during the time frame you would be wearing the monitor. The patch cannot be worn during these tests.  You cannot remove and re-apply the ZIO AT patch monitor.   Your ZIO patch monitor will be sent USPS Priority mail from [facility] directly to your home address. The monitor may also be mailed to a PO BOX if home delivery is not available.   It may take 3-5 days to receive your monitor after you have been enrolled.   Once you have received you monitor, please review enclosed instructions.  Your monitor has already been registered assigning a specific monitor serial # to you.   Applying the monitor   Shave hair from upper left chest.   Hold abrader disc by orange tab.  Rub abrader in 40 strokes over left upper chest as indicated in your monitor instructions.   Clean area with 4 enclosed alcohol pads .  Use all pads to assure are is cleaned thoroughly.  Let dry.   Apply patch as indicated in monitor instructions.  Patch will be place under  collarbone on left side of chest with arrow pointing upward.   Rub patch adhesive wings for 2 minutes.Remove white label marked "1".  Remove white label marked "2".  Rub patch adhesive wings for 2 additional minutes.   While looking in a mirror, press and release button in center of patch.  A small green light will flash 3-4 times .  This will be your only indicator the monitor has been turned on.     Do not shower for the first 24 hours.  You may shower after the first 48 hours.   Press button if you feel a symptom. You will hear a small click.  Record Date, Time and Symptom in the Patient Log Book.   When you are ready to remove patch, follow instructions on last 2 pages of Patient Log Book.  Stick patch monitor onto last page of Patient Log Book.   Place Patient Log Book in [place] box.  Use locking tab on box and tape box closed securely.  The [employer] has [employer] on it.  Please place in mailbox as soon as possible.  Your physician should have your test results approximately 7 days after the monitor has been mailed back to [facility].   Call [facility] Customer Care at [phone] if you have questions regarding your ZIO AT patch monitor.  Call them immediately if you see an orange light blinking on your monitor.   If your monitor falls off in less than 4 days contact  our Monitor department at [phone].  If your monitor becomes loose or falls off after 4 days call [employer] at [phone] for suggestions on securing your monitor.  Follow-Up: Your physician recommends that you schedule a follow-up appointment in: pending  Any Other Special Instructions Will Be Listed Below (If Applicable).  If you need a refill on your cardiac medications before your next appointment, please call your pharmacy.

## 2021-08-21 ENCOUNTER — Ambulatory Visit (INDEPENDENT_AMBULATORY_CARE_PROVIDER_SITE_OTHER): Payer: Medicaid Other

## 2021-08-21 DIAGNOSIS — R55 Syncope and collapse: Secondary | ICD-10-CM | POA: Diagnosis not present

## 2021-08-21 LAB — ECHOCARDIOGRAM COMPLETE
AR max vel: 2.74 cm2
AV Area VTI: 2.92 cm2
AV Area mean vel: 2.77 cm2
AV Mean grad: 2.8 mmHg
AV Peak grad: 5.6 mmHg
Ao pk vel: 1.18 m/s
Area-P 1/2: 2.91 cm2
Calc EF: 64.9 %
S' Lateral: 2.65 cm
Single Plane A2C EF: 69.3 %
Single Plane A4C EF: 60.1 %

## 2021-09-12 ENCOUNTER — Ambulatory Visit (INDEPENDENT_AMBULATORY_CARE_PROVIDER_SITE_OTHER): Payer: Medicaid Other | Admitting: Family Medicine

## 2021-09-12 ENCOUNTER — Encounter: Payer: Self-pay | Admitting: Family Medicine

## 2021-09-12 ENCOUNTER — Ambulatory Visit: Payer: Medicaid Other | Admitting: Family Medicine

## 2021-09-12 VITALS — BP 110/68 | HR 65 | Temp 98.4°F | Ht 67.0 in | Wt 162.0 lb

## 2021-09-12 DIAGNOSIS — F411 Generalized anxiety disorder: Secondary | ICD-10-CM | POA: Diagnosis not present

## 2021-09-12 DIAGNOSIS — E039 Hypothyroidism, unspecified: Secondary | ICD-10-CM | POA: Diagnosis not present

## 2021-09-12 DIAGNOSIS — N898 Other specified noninflammatory disorders of vagina: Secondary | ICD-10-CM

## 2021-09-12 DIAGNOSIS — F330 Major depressive disorder, recurrent, mild: Secondary | ICD-10-CM

## 2021-09-12 LAB — WET PREP FOR TRICH, YEAST, CLUE
Clue Cell Exam: NEGATIVE
Trichomonas Exam: NEGATIVE
Yeast Exam: NEGATIVE

## 2021-09-12 NOTE — Progress Notes (Signed)
Established Patient Office Visit  Subjective   Patient ID: Jennifer Shaw, female    DOB: 17-Aug-2000  Age: 21 y.o. MRN: [id number]  Chief Complaint  Patient presents with   Medical Management of Chronic Issues   Depression   Hypothyroidism   She reports that she has been doing well since her last visit. She has been taking her medication as prescribed. She denies changes in hair, skin, nails, weight, appetite, or menstrual cycles. She does report a vaginal odor for the last few weeks. She is currently on her period otherwise she denies discharge. She denies pain, itching, rash, lesion. She has not had unprotected sex and denies concerns for STDs. There is a history of BV.       09/12/2021    3:04 PM 08/01/2021    3:34 PM 06/19/2021    1:13 PM  Depression screen PHQ 2/9  Decreased Interest 0 2 2  Down, Depressed, Hopeless 0 0 1  PHQ - 2 Score 0 2 3  Altered sleeping 0 2 2  Tired, decreased energy 0 1 3  Change in appetite 0 0 2  Feeling bad or failure about yourself  0 0 0  Trouble concentrating 0 0 0  Moving slowly or fidgety/restless 0 0 0  Suicidal thoughts 0 0 0  PHQ-9 Score 0 5 10  Difficult doing work/chores Not difficult at all Somewhat difficult Somewhat difficult      09/12/2021    3:05 PM 08/01/2021    3:34 PM 06/19/2021    1:14 PM 03/16/2021    2:00 PM  GAD 7 : Generalized Anxiety Score  Nervous, Anxious, on Edge 0 1 0 0  Control/stop worrying 0 2 2 0  Worry too much - different things 0 2 2 0  Trouble relaxing 0 3 3 0  Restless 0 1 0 0  Easily annoyed or irritable 0 2 3 0  Afraid - awful might happen 0 1 0 0  Total GAD 7 Score 0 12 10 0  Anxiety Difficulty Not difficult at all Very difficult Very difficult Not difficult at all      Past Medical History:  Diagnosis Date   Anxiety    Depression    Dysplasia    Headache    Hypothyroidism    RSV infection 12/13/2020   Scoliosis       ROS As per HPI.    Objective:     There were no vitals taken for  this visit.   Physical Exam Vitals and nursing note reviewed.  Constitutional:      General: She is not in acute distress.    Appearance: Normal appearance. She is not ill-appearing, toxic-appearing or diaphoretic.  HENT:     Head: Normocephalic and atraumatic.  Neck:     Thyroid: No thyroid mass, thyromegaly or thyroid tenderness.  Cardiovascular:     Rate and Rhythm: Normal rate and regular rhythm.     Heart sounds: Normal heart sounds. No murmur heard. Pulmonary:     Effort: Pulmonary effort is normal. No respiratory distress.     Breath sounds: Normal breath sounds.  Abdominal:     General: Bowel sounds are normal. There is no distension.     Palpations: Abdomen is soft.     Tenderness: There is no abdominal tenderness. There is no guarding or rebound.  Musculoskeletal:     Right lower leg: No edema.     Left lower leg: No edema.  Skin:    General:  Skin is warm and dry.  Neurological:     General: No focal deficit present.     Mental Status: She is alert and oriented to person, place, and time.     Gait: Gait normal.  Psychiatric:        Mood and Affect: Mood normal.        Behavior: Behavior normal.        Thought Content: Thought content normal.        Judgment: Judgment normal.     No results found for any visits on 09/12/21.    The ASCVD Risk score (Arnett DK, et al., 2019) failed to calculate for the following reasons:   The 2019 ASCVD risk score is only valid for ages 14 to 33    Assessment & Plan:   Mercie was seen today for medical management of chronic issues, depression and hypothyroidism.  Diagnoses and all orders for this visit:  Acquired hypothyroidism On synthroid. TSH level pending. Will titrate pending results if needed.  -     TSH  Generalized anxiety disorder Depression, major, recurrent, mild (HCC) Well controlled on current regimen. Continue prozac.   Vaginal odor Negative for clue cells, trich, yeast. No concerns for STD. Discussed  vaginal hygiene.  -     WET PREP FOR TRICH, YEAST, CLUE  Will determine follow up pending TSH level.   The patient indicates understanding of these issues and agrees with the plan.   Gabriel Earing, FNP

## 2021-09-13 LAB — TSH: TSH: 2.31 u[IU]/mL (ref 0.450–4.500)

## 2021-09-17 ENCOUNTER — Telehealth: Payer: Self-pay | Admitting: *Deleted

## 2021-09-17 NOTE — Telephone Encounter (Signed)
message left on Nikki's vm-she sent me secure chat about as an FYI I received a call from [employer] stating a patient with the initials A.T. was only able to wear the 14 day monitor for 4 days.  They will process the days they had it on but if it is not enough information, they need to know.  The device number is [id number].  Casimiro Needle left a telephone number of [phone].

## 2021-09-26 DIAGNOSIS — N938 Other specified abnormal uterine and vaginal bleeding: Secondary | ICD-10-CM | POA: Diagnosis not present

## 2021-09-26 DIAGNOSIS — N9489 Other specified conditions associated with female genital organs and menstrual cycle: Secondary | ICD-10-CM | POA: Diagnosis not present

## 2021-09-26 DIAGNOSIS — N946 Dysmenorrhea, unspecified: Secondary | ICD-10-CM | POA: Diagnosis not present

## 2021-09-30 DIAGNOSIS — R197 Diarrhea, unspecified: Secondary | ICD-10-CM | POA: Diagnosis not present

## 2021-09-30 DIAGNOSIS — Z5329 Procedure and treatment not carried out because of patient's decision for other reasons: Secondary | ICD-10-CM | POA: Diagnosis not present

## 2021-09-30 DIAGNOSIS — Z20822 Contact with and (suspected) exposure to covid-19: Secondary | ICD-10-CM | POA: Diagnosis not present

## 2021-09-30 DIAGNOSIS — R110 Nausea: Secondary | ICD-10-CM | POA: Diagnosis not present

## 2021-09-30 DIAGNOSIS — R519 Headache, unspecified: Secondary | ICD-10-CM | POA: Diagnosis not present

## 2021-09-30 DIAGNOSIS — R109 Unspecified abdominal pain: Secondary | ICD-10-CM | POA: Diagnosis not present

## 2021-09-30 DIAGNOSIS — J029 Acute pharyngitis, unspecified: Secondary | ICD-10-CM | POA: Diagnosis not present

## 2021-10-01 DIAGNOSIS — B349 Viral infection, unspecified: Secondary | ICD-10-CM | POA: Diagnosis not present

## 2021-11-16 ENCOUNTER — Ambulatory Visit (INDEPENDENT_AMBULATORY_CARE_PROVIDER_SITE_OTHER): Payer: Medicaid Other | Admitting: Family Medicine

## 2021-11-16 VITALS — BP 108/66 | HR 87 | Temp 98.1°F | Ht 67.0 in | Wt 165.4 lb

## 2021-11-16 DIAGNOSIS — N760 Acute vaginitis: Secondary | ICD-10-CM

## 2021-11-16 DIAGNOSIS — H938X3 Other specified disorders of ear, bilateral: Secondary | ICD-10-CM

## 2021-11-16 DIAGNOSIS — N3000 Acute cystitis without hematuria: Secondary | ICD-10-CM

## 2021-11-16 DIAGNOSIS — R300 Dysuria: Secondary | ICD-10-CM | POA: Diagnosis not present

## 2021-11-16 LAB — URINALYSIS, COMPLETE
Bilirubin, UA: NEGATIVE
Glucose, UA: NEGATIVE
Ketones, UA: NEGATIVE
Nitrite, UA: POSITIVE — AB
Specific Gravity, UA: >1.03 — ABNORMAL HIGH (ref 1.005–1.030)
Urobilinogen, Ur: 2 mg/dL — ABNORMAL HIGH (ref 0.2–1.0)
pH, UA: 6 (ref 5.0–7.5)

## 2021-11-16 LAB — MICROSCOPIC EXAMINATION
Epithelial Cells (non renal): NONE SEEN /hpf (ref 0–10)
Renal Epithel, UA: NONE SEEN /hpf

## 2021-11-16 LAB — WET PREP FOR TRICH, YEAST, CLUE
Clue Cell Exam: NEGATIVE
Trichomonas Exam: NEGATIVE
Yeast Exam: NEGATIVE

## 2021-11-16 MED ORDER — NITROFURANTOIN MONOHYD MACRO 100 MG PO CAPS: 100.0000 mg | ORAL_CAPSULE | Freq: Two times a day (BID) | ORAL | 0 refills | Status: AC

## 2021-11-16 MED ORDER — FLUTICASONE PROPIONATE 50 MCG/ACT NA SUSP: 2.0000 | Freq: Every day | NASAL | 6 refills | Status: DC

## 2021-11-16 NOTE — Progress Notes (Signed)
Acute Office Visit  Subjective:     Patient ID: Jennifer Shaw, female    DOB: 08/24/2000, 21 y.o.   MRN: [id number]  Chief Complaint  Patient presents with   Vaginitis    HPI Patient is in today for vaginal itching and urinary odor for 6 days. She also reports dysuria and white vaginal discharge. Denies fever, chills, flank pain, nausea, vomiting, abdominal pain, urgency, or frequency. She is sexually active. She has been using condom. Denies concerns for STIs.   She reports bilateral ear pressure. No fever, pain, or discharge.   ROS As per HPI.      Objective:    BP 108/66   Pulse 87   Temp 98.1 F (36.7 C) (Temporal)   Ht 5\' 7"  (1.702 m)   Wt 165 lb 6 oz (75 kg)   SpO2 99%   BMI 25.90 kg/m    Physical Exam Vitals and nursing note reviewed.  Constitutional:      General: She is not in acute distress.    Appearance: She is not ill-appearing, toxic-appearing or diaphoretic.  HENT:     Right Ear: Tympanic membrane, ear canal and external ear normal.     Left Ear: Tympanic membrane, ear canal and external ear normal.  Pulmonary:     Effort: Pulmonary effort is normal. No respiratory distress.  Abdominal:     General: There is no distension.     Tenderness: There is no abdominal tenderness. There is no right CVA tenderness, left CVA tenderness, guarding or rebound.  Musculoskeletal:     Right lower leg: No edema.     Left lower leg: No edema.  Skin:    General: Skin is warm and dry.  Neurological:     General: No focal deficit present.     Mental Status: She is alert and oriented to person, place, and time.  Psychiatric:        Mood and Affect: Mood normal.        Behavior: Behavior normal.     Results for orders placed or performed in visit on 11/16/21  WET PREP FOR TRICH, YEAST, CLUE   Specimen: Vaginal Fluid   Vaginal Flui  Result Value Ref Range   Trichomonas Exam Negative Negative   Yeast Exam Negative Negative   Clue Cell Exam Negative Negative   Urine Culture   Specimen: Urine   UR  Result Value Ref Range   Urine Culture, Routine Final report (A)    Organism ID, Bacteria Klebsiella pneumoniae (A)    Antimicrobial Susceptibility Comment   Microscopic Examination   Urine  Result Value Ref Range   WBC, UA 6-10 (A) 0 - 5 /hpf   RBC, Urine 0-2 0 - 2 /hpf   Epithelial Cells (non renal) None seen 0 - 10 /hpf   Renal Epithel, UA None seen None seen /hpf   Bacteria, UA Many (A) None seen/Few  Urinalysis, Complete  Result Value Ref Range   Specific Gravity, UA >1.030 (H) 1.005 - 1.030   pH, UA 6.0 5.0 - 7.5   Color, UA Yellow Yellow   Appearance Ur Cloudy (A) Clear   Leukocytes,UA 1+ (A) Negative   Protein,UA 1+ (A) Negative/Trace   Glucose, UA Negative Negative   Ketones, UA Negative Negative   RBC, UA Trace (A) Negative   Bilirubin, UA Negative Negative   Urobilinogen, Ur 2.0 (H) 0.2 - 1.0 mg/dL   Nitrite, UA Positive (A) Negative   Microscopic Examination See below:  Assessment & Plan:   [name] was seen today for vaginitis.  Diagnoses and all orders for this visit:  Acute cystitis without hematuria UA positive. Macrobid as below.  -     Urinalysis, Complete -     nitrofurantoin, macrocrystal-monohydrate, (MACROBID) 100 MG capsule; Take 1 capsule (100 mg total) by mouth 2 (two) times daily for 5 days. -     Urine Culture -     Microscopic Examination  Acute vaginitis Negative wet prep. Discussed vaginal hygiene.  -     WET PREP FOR TRICH, YEAST, CLUE  Congestion of both ears Normal exam. Try flonase as below.  -     fluticasone (FLONASE) 50 MCG/ACT nasal spray; Place 2 sprays into both nostrils daily.   Return in about 1 month (around 12/17/2021) for chronic follow up.  Gwenlyn Perking, FNP

## 2021-11-18 LAB — URINE CULTURE

## 2021-11-20 ENCOUNTER — Encounter: Payer: Self-pay | Admitting: Family Medicine

## 2021-12-17 ENCOUNTER — Ambulatory Visit: Payer: Medicaid Other | Admitting: Family Medicine

## 2021-12-21 DIAGNOSIS — R059 Cough, unspecified: Secondary | ICD-10-CM | POA: Diagnosis not present

## 2021-12-21 DIAGNOSIS — Z2089 Contact with and (suspected) exposure to other communicable diseases: Secondary | ICD-10-CM | POA: Diagnosis not present

## 2021-12-21 DIAGNOSIS — B9689 Other specified bacterial agents as the cause of diseases classified elsewhere: Secondary | ICD-10-CM | POA: Diagnosis not present

## 2021-12-21 DIAGNOSIS — J988 Other specified respiratory disorders: Secondary | ICD-10-CM | POA: Diagnosis not present

## 2022-01-26 DIAGNOSIS — J020 Streptococcal pharyngitis: Secondary | ICD-10-CM | POA: Diagnosis not present

## 2022-01-26 DIAGNOSIS — J029 Acute pharyngitis, unspecified: Secondary | ICD-10-CM | POA: Diagnosis not present

## 2022-04-20 DIAGNOSIS — Z20822 Contact with and (suspected) exposure to covid-19: Secondary | ICD-10-CM | POA: Diagnosis not present

## 2022-04-20 DIAGNOSIS — R059 Cough, unspecified: Secondary | ICD-10-CM | POA: Diagnosis not present

## 2022-04-24 ENCOUNTER — Ambulatory Visit (INDEPENDENT_AMBULATORY_CARE_PROVIDER_SITE_OTHER): Payer: Medicaid Other | Admitting: Family Medicine

## 2022-04-24 ENCOUNTER — Encounter: Payer: Self-pay | Admitting: Family Medicine

## 2022-04-24 VITALS — BP 109/63 | HR 83 | Temp 98.1°F | Ht 67.0 in | Wt 159.5 lb

## 2022-04-24 DIAGNOSIS — F411 Generalized anxiety disorder: Secondary | ICD-10-CM | POA: Diagnosis not present

## 2022-04-24 DIAGNOSIS — E039 Hypothyroidism, unspecified: Secondary | ICD-10-CM

## 2022-04-24 DIAGNOSIS — Z113 Encounter for screening for infections with a predominantly sexual mode of transmission: Secondary | ICD-10-CM | POA: Diagnosis not present

## 2022-04-24 DIAGNOSIS — F330 Major depressive disorder, recurrent, mild: Secondary | ICD-10-CM | POA: Diagnosis not present

## 2022-04-24 DIAGNOSIS — N898 Other specified noninflammatory disorders of vagina: Secondary | ICD-10-CM

## 2022-04-24 DIAGNOSIS — Z30011 Encounter for initial prescription of contraceptive pills: Secondary | ICD-10-CM

## 2022-04-24 LAB — WET PREP FOR TRICH, YEAST, CLUE
Clue Cell Exam: NEGATIVE
Trichomonas Exam: NEGATIVE
Yeast Exam: NEGATIVE

## 2022-04-24 LAB — PREGNANCY, URINE: Preg Test, Ur: NEGATIVE

## 2022-04-24 MED ORDER — NORETHIN ACE-ETH ESTRAD-FE 1-20 MG-MCG PO TABS: 1.0000 | ORAL_TABLET | Freq: Every day | ORAL | 3 refills | Status: DC

## 2022-04-24 NOTE — Progress Notes (Signed)
Established Patient Office Visit  Subjective   Patient ID: Jennifer Shaw, female    DOB: 2000/05/28  Age: 22 y.o. MRN: [id number]  Chief Complaint  Patient presents with   Medical Management of Chronic Issues   Hypothyroidism   Depression   Vaginitis    Vaginal Discharge The patient's primary symptoms include a genital odor and vaginal discharge. The patient's pertinent negatives include no genital itching or genital lesions. This is a new problem. The current episode started yesterday. The problem has been unchanged. The patient is experiencing no pain. She is not pregnant. Pertinent negatives include no abdominal pain, anorexia, back pain, chills, constipation, diarrhea, discolored urine, dysuria, fever, flank pain, frequency, headaches, hematuria, joint pain, joint swelling, nausea, painful intercourse, rash, sore throat, urgency or vomiting. The vaginal discharge was white. There has been no bleeding. She has not been passing clots. She has not been passing tissue. She has tried nothing for the symptoms. She is sexually active. No, her partner does not have an STD. She uses nothing for contraception. Her menstrual history has been irregular.   She is interested in re-starting OCPs. She does have a history of irregular cycles. She is sexually active and sometimes uses condoms.   Hypothyroidism Compliant with medications - Yes Current medications - levothyroxine 75 mcg Weight - stable  Bowel habit changes - No Heat or cold intolerance - cold intolerance Mood changes - No Changes in sleep habits - No Fatigue - No Skin, hair, or nail changes - No Tremor - No Palpitations - No Edema - No Shortness of breath - No Changes in menses - irregular- normal for her  2. Depression/anxiety Reports well controlled with prozac without side effects.      04/24/2022    9:25 AM 11/16/2021    2:48 PM 09/12/2021    3:04 PM  Depression screen PHQ 2/9  Decreased Interest 0 0 0  Down, Depressed,  Hopeless 0 0 0  PHQ - 2 Score 0 0 0  Altered sleeping 3 0 0  Tired, decreased energy 0 0 0  Change in appetite 1 2 0  Feeling bad or failure about yourself  0 0 0  Trouble concentrating 0 0 0  Moving slowly or fidgety/restless 0 0 0  Suicidal thoughts 0 0 0  PHQ-9 Score 4 2 0  Difficult doing work/chores Not difficult at all Not difficult at all Not difficult at all      04/24/2022    9:25 AM 11/16/2021    2:49 PM 09/12/2021    3:05 PM 08/01/2021    3:34 PM  GAD 7 : Generalized Anxiety Score  Nervous, Anxious, on Edge 0 0 0 1  Control/stop worrying 0 0 0 2  Worry too much - different things 0 0 0 2  Trouble relaxing 0 0 0 3  Restless 0 0 0 1  Easily annoyed or irritable 0 2 0 2  Afraid - awful might happen 0 0 0 1  Total GAD 7 Score 0 2 0 12  Anxiety Difficulty Not difficult at all Not difficult at all Not difficult at all Very difficult     Past Medical History:  Diagnosis Date   Anxiety    Depression    Dysplasia    Headache    Hypothyroidism    RSV infection 12/13/2020   Scoliosis       Review of Systems  Constitutional:  Negative for chills and fever.  HENT:  Negative for sore throat.  Gastrointestinal:  Negative for abdominal pain, anorexia, constipation, diarrhea, nausea and vomiting.  Genitourinary:  Positive for vaginal discharge. Negative for dysuria, flank pain, frequency, hematuria and urgency.  Musculoskeletal:  Negative for back pain and joint pain.  Skin:  Negative for rash.  Neurological:  Negative for headaches.      Objective:     BP 109/63   Pulse 83   Temp 98.1 F (36.7 C) (Temporal)   Ht '5\' 7"'$  (1.702 m)   Wt 159 lb 8 oz (72.3 kg)   SpO2 98%   BMI 24.98 kg/m    Physical Exam Vitals and nursing note reviewed.  Constitutional:      General: She is not in acute distress.    Appearance: Normal appearance. She is not ill-appearing, toxic-appearing or diaphoretic.  Neck:     Thyroid: No thyroid mass, thyromegaly or thyroid tenderness.   Cardiovascular:     Rate and Rhythm: Normal rate and regular rhythm.     Heart sounds: Normal heart sounds. No murmur heard. Pulmonary:     Effort: Pulmonary effort is normal.     Breath sounds: Normal breath sounds.  Abdominal:     General: Bowel sounds are normal. There is no distension.     Palpations: Abdomen is soft.     Tenderness: There is no abdominal tenderness. There is no guarding or rebound.  Musculoskeletal:     Cervical back: Neck supple. No rigidity.  Skin:    General: Skin is warm and dry.  Neurological:     General: No focal deficit present.     Mental Status: She is alert and oriented to person, place, and time.  Psychiatric:        Mood and Affect: Mood normal.        Behavior: Behavior normal.        Thought Content: Thought content normal.        Judgment: Judgment normal.      No results found for any visits on 04/24/22.    The ASCVD Risk score (Arnett DK, et al., 2019) failed to calculate for the following reasons:   The 2019 ASCVD risk score is only valid for ages 38 to 67    Assessment & Plan:   Jennifer Shaw was seen today for medical management of chronic issues, hypothyroidism, depression and vaginitis.  Diagnoses and all orders for this visit:  Vaginal discharge Negative for yeast, trich, and BV. STD screening pending.  -     WET PREP FOR Jennifer Shaw, YEAST, CLUE  Screening for STD (sexually transmitted disease) Pending.  -     Ct Ng M genitalium NAA, Urine  Encounter for initial prescription of contraceptive pills Negative urine pregnancy. OCPs discussed and ordered.  -     Pregnancy, urine  Depression, major, recurrent, mild (HCC) Generalized anxiety disorder Well controlled on current regimen.   Acquired hypothyroidism TSH pending. Continue synthroid.  -     TSH  Return in about 6 months (around 10/25/2022) for medication follow up.   The patient indicates understanding of these issues and agrees with the plan.  Gwenlyn Perking,  FNP

## 2022-04-25 LAB — TSH: TSH: 3.52 u[IU]/mL (ref 0.450–4.500)

## 2022-04-27 LAB — CT NG M GENITALIUM NAA, URINE
Chlamydia trachomatis, NAA: NEGATIVE
Mycoplasma genitalium NAA: NEGATIVE
Neisseria gonorrhoeae, NAA: NEGATIVE

## 2022-05-01 DIAGNOSIS — S6721XD Crushing injury of right hand, subsequent encounter: Secondary | ICD-10-CM | POA: Diagnosis not present

## 2022-05-02 DIAGNOSIS — R112 Nausea with vomiting, unspecified: Secondary | ICD-10-CM | POA: Diagnosis not present

## 2022-05-02 DIAGNOSIS — R300 Dysuria: Secondary | ICD-10-CM | POA: Diagnosis not present

## 2022-05-02 DIAGNOSIS — R1030 Lower abdominal pain, unspecified: Secondary | ICD-10-CM | POA: Diagnosis not present

## 2022-05-02 DIAGNOSIS — E039 Hypothyroidism, unspecified: Secondary | ICD-10-CM | POA: Diagnosis not present

## 2022-05-02 DIAGNOSIS — Z20822 Contact with and (suspected) exposure to covid-19: Secondary | ICD-10-CM | POA: Diagnosis not present

## 2022-05-02 DIAGNOSIS — F17290 Nicotine dependence, other tobacco product, uncomplicated: Secondary | ICD-10-CM | POA: Diagnosis not present

## 2022-05-02 DIAGNOSIS — E079 Disorder of thyroid, unspecified: Secondary | ICD-10-CM | POA: Diagnosis not present

## 2022-05-02 DIAGNOSIS — K529 Noninfective gastroenteritis and colitis, unspecified: Secondary | ICD-10-CM | POA: Diagnosis not present

## 2022-05-02 DIAGNOSIS — Z1152 Encounter for screening for COVID-19: Secondary | ICD-10-CM | POA: Diagnosis not present

## 2022-05-02 DIAGNOSIS — R350 Frequency of micturition: Secondary | ICD-10-CM | POA: Diagnosis not present

## 2022-05-02 DIAGNOSIS — Z79890 Hormone replacement therapy (postmenopausal): Secondary | ICD-10-CM | POA: Diagnosis not present

## 2022-05-23 ENCOUNTER — Encounter: Payer: Self-pay | Admitting: Family Medicine

## 2022-07-25 DIAGNOSIS — R079 Chest pain, unspecified: Secondary | ICD-10-CM | POA: Diagnosis not present

## 2022-07-25 DIAGNOSIS — T7840XA Allergy, unspecified, initial encounter: Secondary | ICD-10-CM | POA: Diagnosis not present

## 2022-07-25 DIAGNOSIS — R069 Unspecified abnormalities of breathing: Secondary | ICD-10-CM | POA: Diagnosis not present

## 2022-10-23 ENCOUNTER — Encounter: Payer: Self-pay | Admitting: Family Medicine

## 2022-10-23 ENCOUNTER — Ambulatory Visit (INDEPENDENT_AMBULATORY_CARE_PROVIDER_SITE_OTHER): Payer: Medicaid Other | Admitting: Family Medicine

## 2022-10-23 VITALS — BP 115/71 | HR 91 | Temp 98.3°F | Ht 67.0 in | Wt 153.4 lb

## 2022-10-23 DIAGNOSIS — Z3041 Encounter for surveillance of contraceptive pills: Secondary | ICD-10-CM

## 2022-10-23 DIAGNOSIS — Z113 Encounter for screening for infections with a predominantly sexual mode of transmission: Secondary | ICD-10-CM | POA: Diagnosis not present

## 2022-10-23 DIAGNOSIS — E039 Hypothyroidism, unspecified: Secondary | ICD-10-CM | POA: Diagnosis not present

## 2022-10-23 DIAGNOSIS — F411 Generalized anxiety disorder: Secondary | ICD-10-CM

## 2022-10-23 DIAGNOSIS — F330 Major depressive disorder, recurrent, mild: Secondary | ICD-10-CM | POA: Diagnosis not present

## 2022-10-23 DIAGNOSIS — N912 Amenorrhea, unspecified: Secondary | ICD-10-CM | POA: Diagnosis not present

## 2022-10-23 DIAGNOSIS — E162 Hypoglycemia, unspecified: Secondary | ICD-10-CM

## 2022-10-23 LAB — PREGNANCY, URINE: Preg Test, Ur: NEGATIVE

## 2022-10-23 MED ORDER — FLUOXETINE HCL 20 MG PO CAPS
20.0000 mg | ORAL_CAPSULE | Freq: Every day | ORAL | 3 refills | Status: DC
Start: 2022-10-23 — End: 2023-06-19

## 2022-10-23 MED ORDER — NORETHIN ACE-ETH ESTRAD-FE 1-20 MG-MCG PO TABS
1.0000 | ORAL_TABLET | Freq: Every day | ORAL | 3 refills | Status: DC
Start: 2022-10-23 — End: 2023-07-04

## 2022-10-23 NOTE — Progress Notes (Signed)
Established Patient Office Visit  Subjective   Patient ID: Jennifer Shaw, female    DOB: 03/25/2000  Age: 22 y.o. MRN: [id number]  Chief Complaint  Patient presents with   Medical Management of Chronic Issues   Hypothyroidism   std screening    HPI  Thyroid Compliant with medications - No. She has been out of levothyroxine for months.  Current medications -  levothyroxine 75 mcg Weight - some weight loss  Has irregular cycles- isn't sure if this has changed Sleep- has started working 3rd shift, so unsure if this has changes Mood- has been off prozac so she has had increased anxiety  2. Amenorrhea It has been 2 months since her last cycle. Hx of irregular cycles, so this is not unusual for her. Not currently on OCPs and would like to started back on it. She has been sexually active without protection, though not within the last week.   3. Hypoglycemia Known history of hypoglycemia that has resulted in multiple episodes of syncope in the past. She has been having episodes of feels clammy, jittery, and lightheaded while working in the hours before and after her lunch break. She works an 8 hour shift and get a 30 minute lunch break int he middle.She is unable to eat or drink otherwise. Work required a note from a provider to do so.   4. Anxiety/depression Has been off on prozac for months and would like to restarted. Feels anxious, irritable, easily angered. Would like to get restarted.      10/23/2022    9:42 AM 04/24/2022    9:25 AM 11/16/2021    2:48 PM  Depression screen PHQ 2/9  Decreased Interest 2 0 0  Down, Depressed, Hopeless 0 0 0  PHQ - 2 Score 2 0 0  Altered sleeping 3 3 0  Tired, decreased energy 3 0 0  Change in appetite 2 1 2   Feeling bad or failure about yourself  0 0 0  Trouble concentrating 3 0 0  Moving slowly or fidgety/restless 0 0 0  Suicidal thoughts 0 0 0  PHQ-9 Score 13 4 2   Difficult doing work/chores Somewhat difficult Not difficult at all Not  difficult at all      10/23/2022    9:42 AM 04/24/2022    9:25 AM 11/16/2021    2:49 PM 09/12/2021    3:05 PM  GAD 7 : Generalized Anxiety Score  Nervous, Anxious, on Edge 3 0 0 0  Control/stop worrying 2 0 0 0  Worry too much - different things 3 0 0 0  Trouble relaxing 3 0 0 0  Restless 3 0 0 0  Easily annoyed or irritable 3 0 2 0  Afraid - awful might happen 0 0 0 0  Total GAD 7 Score 17 0 2 0  Anxiety Difficulty Very difficult Not difficult at all Not difficult at all Not difficult at all        ROS    Objective:     BP 115/71   Pulse 91   Temp 98.3 F (36.8 C) (Temporal)   Ht 5\' 7"  (1.702 m)   Wt 153 lb 6 oz (69.6 kg)   LMP 08/22/2022 (Approximate)   SpO2 98%   BMI 24.02 kg/m  Wt Readings from Last 3 Encounters:  10/23/22 153 lb 6 oz (69.6 kg)  04/24/22 159 lb 8 oz (72.3 kg)  11/16/21 165 lb 6 oz (75 kg) (90%, Z= 1.27)*   * Growth percentiles  are based on CDC (Girls, 2-20 Years) data.      Physical Exam Vitals and nursing note reviewed.  Constitutional:      General: She is not in acute distress.    Appearance: Normal appearance. She is not ill-appearing, toxic-appearing or diaphoretic.  HENT:     Mouth/Throat:     Mouth: Mucous membranes are moist.     Pharynx: Oropharynx is clear.  Eyes:     Extraocular Movements: Extraocular movements intact.     Pupils: Pupils are equal, round, and reactive to light.  Neck:     Thyroid: No thyroid mass, thyromegaly or thyroid tenderness.  Cardiovascular:     Rate and Rhythm: Normal rate and regular rhythm.     Heart sounds: Normal heart sounds. No murmur heard. Pulmonary:     Effort: Pulmonary effort is normal. No respiratory distress.     Breath sounds: Normal breath sounds.  Musculoskeletal:     Cervical back: Neck supple. No rigidity.     Right lower leg: No edema.     Left lower leg: No edema.  Skin:    General: Skin is warm and dry.  Neurological:     General: No focal deficit present.     Mental  Status: She is alert and oriented to person, place, and time.  Psychiatric:        Mood and Affect: Mood normal.      No results found for any visits on 10/23/22.    The ASCVD Risk score (Arnett DK, et al., 2019) failed to calculate for the following reasons:   The 2019 ASCVD risk score is only valid for ages 77 to 32    Assessment & Plan:   Alivianna was seen today for medical management of chronic issues, hypothyroidism and std screening.  Diagnoses and all orders for this visit:  Depression, major, recurrent, mild (HCC) Generalized anxiety disorder Uncontrolled. Denies SI. Restart prozac.  -     FLUoxetine (PROZAC) 20 MG capsule; Take 1 capsule (20 mg total) by mouth daily.  Acquired hypothyroidism Labs pending. Will sent in levothyroxine refill pending labs. She has been off of medication for 2 months.  -     CBC with Differential/Platelet -     CMP14+EGFR -     TSH -     T4, Free  Screening for STD (sexually transmitted disease) -     Ct Ng M genitalium NAA, Urine  Amenorrhea Urine pregnancy negative. Hx of irregular cycles.  -     Cancel: POCT urine pregnancy -     Pregnancy, urine  Encounter for surveillance of contraceptive pills Restart. Back up method for 7 days.  -     norethindrone-ethinyl estradiol-FE (JUNEL FE 1/20) 1-20 MG-MCG tablet; Take 1 tablet by mouth daily.  Hypoglycemia Will complete forms when provided to allow additional break times for snacks to stabilize blood sugar. Discussed well balanced diet, protein and fat intake with carbs to stabilize blood sugar, frequent snacks.   Return in about 6 weeks (around 12/04/2022) for medication follow up.   The patient indicates understanding of these issues and agrees with the plan.  Gabriel Earing, FNP

## 2022-10-24 ENCOUNTER — Ambulatory Visit: Payer: Medicaid Other

## 2022-10-24 ENCOUNTER — Other Ambulatory Visit: Payer: Self-pay | Admitting: Family Medicine

## 2022-10-24 DIAGNOSIS — E039 Hypothyroidism, unspecified: Secondary | ICD-10-CM

## 2022-10-24 LAB — CBC WITH DIFFERENTIAL/PLATELET
Basophils Absolute: 0 10*3/uL (ref 0.0–0.2)
Basos: 1 %
EOS (ABSOLUTE): 0 10*3/uL (ref 0.0–0.4)
Eos: 0 %
Hematocrit: 45.7 % (ref 34.0–46.6)
Hemoglobin: 15.3 g/dL (ref 11.1–15.9)
Immature Grans (Abs): 0 10*3/uL (ref 0.0–0.1)
Immature Granulocytes: 0 %
Lymphocytes Absolute: 1.5 10*3/uL (ref 0.7–3.1)
Lymphs: 26 %
MCH: 31.8 pg (ref 26.6–33.0)
MCHC: 33.5 g/dL (ref 31.5–35.7)
MCV: 95 fL (ref 79–97)
Monocytes Absolute: 0.4 10*3/uL (ref 0.1–0.9)
Monocytes: 6 %
Neutrophils Absolute: 4 10*3/uL (ref 1.4–7.0)
Neutrophils: 67 %
Platelets: 213 10*3/uL (ref 150–450)
RBC: 4.81 x10E6/uL (ref 3.77–5.28)
RDW: 12.4 % (ref 11.7–15.4)
WBC: 6 10*3/uL (ref 3.4–10.8)

## 2022-10-24 LAB — CMP14+EGFR
ALT: 10 IU/L (ref 0–32)
AST: 16 IU/L (ref 0–40)
Albumin: 5 g/dL (ref 4.0–5.0)
Alkaline Phosphatase: 86 IU/L (ref 42–106)
BUN/Creatinine Ratio: 11 (ref 9–23)
BUN: 9 mg/dL (ref 6–20)
Bilirubin Total: 0.5 mg/dL (ref 0.0–1.2)
CO2: 25 mmol/L (ref 20–29)
Calcium: 10.3 mg/dL — ABNORMAL HIGH (ref 8.7–10.2)
Chloride: 101 mmol/L (ref 96–106)
Creatinine, Ser: 0.84 mg/dL (ref 0.57–1.00)
Globulin, Total: 2.6 g/dL (ref 1.5–4.5)
Glucose: 88 mg/dL (ref 70–99)
Potassium: 4.9 mmol/L (ref 3.5–5.2)
Sodium: 141 mmol/L (ref 134–144)
Total Protein: 7.6 g/dL (ref 6.0–8.5)
eGFR: 102 mL/min/{1.73_m2} (ref >59)

## 2022-10-24 LAB — TSH: TSH: 4.52 u[IU]/mL — ABNORMAL HIGH (ref 0.450–4.500)

## 2022-10-24 LAB — T4, FREE: Free T4: 1.18 ng/dL (ref 0.82–1.77)

## 2022-10-24 MED ORDER — LEVOTHYROXINE SODIUM 50 MCG PO TABS
50.0000 ug | ORAL_TABLET | Freq: Every day | ORAL | 3 refills | Status: DC
Start: 2022-10-24 — End: 2023-06-27

## 2022-10-25 DIAGNOSIS — R197 Diarrhea, unspecified: Secondary | ICD-10-CM | POA: Diagnosis not present

## 2022-10-25 DIAGNOSIS — F419 Anxiety disorder, unspecified: Secondary | ICD-10-CM | POA: Diagnosis not present

## 2022-10-25 DIAGNOSIS — R1110 Vomiting, unspecified: Secondary | ICD-10-CM | POA: Diagnosis not present

## 2022-10-25 DIAGNOSIS — R109 Unspecified abdominal pain: Secondary | ICD-10-CM | POA: Diagnosis not present

## 2022-10-25 DIAGNOSIS — I10 Essential (primary) hypertension: Secondary | ICD-10-CM | POA: Diagnosis not present

## 2022-10-25 LAB — CT NG M GENITALIUM NAA, URINE
Chlamydia trachomatis, NAA: NEGATIVE
Mycoplasma genitalium NAA: NEGATIVE
Neisseria gonorrhoeae, NAA: NEGATIVE

## 2022-12-05 ENCOUNTER — Encounter: Payer: Self-pay | Admitting: Family Medicine

## 2022-12-05 ENCOUNTER — Ambulatory Visit (INDEPENDENT_AMBULATORY_CARE_PROVIDER_SITE_OTHER): Payer: Self-pay | Admitting: Family Medicine

## 2022-12-05 ENCOUNTER — Ambulatory Visit: Payer: Medicaid Other | Admitting: Family Medicine

## 2022-12-05 VITALS — BP 108/64 | HR 74 | Temp 98.5°F | Ht 67.0 in | Wt 159.0 lb

## 2022-12-05 DIAGNOSIS — F411 Generalized anxiety disorder: Secondary | ICD-10-CM

## 2022-12-05 DIAGNOSIS — E039 Hypothyroidism, unspecified: Secondary | ICD-10-CM

## 2022-12-05 DIAGNOSIS — B349 Viral infection, unspecified: Secondary | ICD-10-CM

## 2022-12-05 DIAGNOSIS — F330 Major depressive disorder, recurrent, mild: Secondary | ICD-10-CM

## 2022-12-05 NOTE — Progress Notes (Signed)
Acute Office Visit  Subjective:     Patient ID: Jennifer Shaw, female    DOB: 07/20/2000, 22 y.o.   MRN: [id number]  Chief Complaint  Patient presents with   Nausea    URI  This is a new problem. Episode onset: x3 days. The problem has been unchanged. The maximum temperature recorded prior to her arrival was 100.4 - 100.9 F. The fever has been present for Less than 1 day. Associated symptoms include congestion, coughing, diarrhea, headaches, nausea, sneezing and a sore throat. Pertinent negatives include no abdominal pain, chest pain, dysuria, ear pain, joint pain, joint swelling, neck pain, plugged ear sensation, rash, rhinorrhea, sinus pain, swollen glands, vomiting or wheezing. She has tried nothing for the symptoms.   Restarted prozac. Doing well without side effects.   Restarted levothyroxine. Compliant with medication. Denies symptoms.      12/05/2022    1:27 PM 10/23/2022    9:42 AM 04/24/2022    9:25 AM  Depression screen PHQ 2/9  Decreased Interest 1 2 0  Down, Depressed, Hopeless 0 0 0  PHQ - 2 Score 1 2 0  Altered sleeping 2 3 3   Tired, decreased energy 0 3 0  Change in appetite 0 2 1  Feeling bad or failure about yourself  0 0 0  Trouble concentrating 0 3 0  Moving slowly or fidgety/restless 0 0 0  Suicidal thoughts 0 0 0  PHQ-9 Score 3 13 4   Difficult doing work/chores Not difficult at all Somewhat difficult Not difficult at all      12/05/2022    1:27 PM 10/23/2022    9:42 AM 04/24/2022    9:25 AM 11/16/2021    2:49 PM  GAD 7 : Generalized Anxiety Score  Nervous, Anxious, on Edge 0 3 0 0  Control/stop worrying 0 2 0 0  Worry too much - different things 0 3 0 0  Trouble relaxing 0 3 0 0  Restless 0 3 0 0  Easily annoyed or irritable 0 3 0 2  Afraid - awful might happen 0 0 0 0  Total GAD 7 Score 0 17 0 2  Anxiety Difficulty Not difficult at all Very difficult Not difficult at all Not difficult at all     Review of Systems  HENT:  Positive for  congestion, sneezing and sore throat. Negative for ear pain, rhinorrhea and sinus pain.   Respiratory:  Positive for cough. Negative for wheezing.   Cardiovascular:  Negative for chest pain.  Gastrointestinal:  Positive for diarrhea and nausea. Negative for abdominal pain and vomiting.  Genitourinary:  Negative for dysuria.  Musculoskeletal:  Negative for joint pain and neck pain.  Skin:  Negative for rash.  Neurological:  Positive for headaches.        Objective:    BP 108/64   Pulse 74   Temp 98.5 F (36.9 C) (Temporal)   Ht 5\' 7"  (1.702 m)   Wt 159 lb (72.1 kg)   SpO2 98%   BMI 24.90 kg/m    Physical Exam Vitals and nursing note reviewed.  Constitutional:      General: She is not in acute distress.    Appearance: Normal appearance. She is not ill-appearing, toxic-appearing or diaphoretic.  HENT:     Head: Normocephalic and atraumatic.     Right Ear: Tympanic membrane, ear canal and external ear normal.     Left Ear: Tympanic membrane, ear canal and external ear normal.     Nose:  Congestion present.     Mouth/Throat:     Mouth: Mucous membranes are moist.     Pharynx: Oropharynx is clear. No oropharyngeal exudate or posterior oropharyngeal erythema.  Eyes:     General:        Right eye: No discharge.        Left eye: No discharge.     Conjunctiva/sclera: Conjunctivae normal.  Cardiovascular:     Rate and Rhythm: Normal rate and regular rhythm.     Heart sounds: Normal heart sounds. No murmur heard. Pulmonary:     Effort: Pulmonary effort is normal. No respiratory distress.     Breath sounds: Normal breath sounds. No wheezing or rhonchi.  Abdominal:     General: Bowel sounds are normal. There is no distension.     Palpations: Abdomen is soft.     Tenderness: There is no abdominal tenderness. There is no guarding or rebound.  Musculoskeletal:     Cervical back: Neck supple.     Right lower leg: No edema.     Left lower leg: No edema.  Lymphadenopathy:      Cervical: No cervical adenopathy.  Neurological:     General: No focal deficit present.     Mental Status: She is alert and oriented to person, place, and time.  Psychiatric:        Mood and Affect: Mood normal.        Behavior: Behavior normal.     No results found for any visits on 12/05/22.      Assessment & Plan:   Jennifer Shaw was seen today for nausea.  Diagnoses and all orders for this visit:  Viral illness Discussed symptomatic care and return precautions.   Acquired hypothyroidism Complaint with levothyroxine. Insurance is currently lapsed. She will return for TSH in a few weeks when insurance is active.  -     TSH; Future  Depression, major, recurrent, mild (HCC) Generalized anxiety disorder Well controlled on current regimen.   Return in about 6 months (around 06/05/2023) for chronic follow up.  The patient indicates understanding of these issues and agrees with the plan.   Jennifer Earing, FNP

## 2023-05-18 DIAGNOSIS — R112 Nausea with vomiting, unspecified: Secondary | ICD-10-CM | POA: Diagnosis not present

## 2023-05-18 DIAGNOSIS — L559 Sunburn, unspecified: Secondary | ICD-10-CM | POA: Diagnosis not present

## 2023-06-09 ENCOUNTER — Ambulatory Visit: Payer: Self-pay | Admitting: Family Medicine

## 2023-06-11 ENCOUNTER — Encounter: Payer: Self-pay | Admitting: Family Medicine

## 2023-06-19 ENCOUNTER — Encounter: Payer: Self-pay | Admitting: Family Medicine

## 2023-06-19 ENCOUNTER — Ambulatory Visit: Admitting: Family Medicine

## 2023-06-19 ENCOUNTER — Ambulatory Visit: Payer: Self-pay

## 2023-06-19 VITALS — BP 113/69 | HR 83 | Temp 98.9°F | Ht 67.0 in | Wt 185.6 lb

## 2023-06-19 DIAGNOSIS — F411 Generalized anxiety disorder: Secondary | ICD-10-CM | POA: Diagnosis not present

## 2023-06-19 DIAGNOSIS — E039 Hypothyroidism, unspecified: Secondary | ICD-10-CM

## 2023-06-19 DIAGNOSIS — F330 Major depressive disorder, recurrent, mild: Secondary | ICD-10-CM | POA: Diagnosis not present

## 2023-06-19 MED ORDER — PROPRANOLOL HCL 10 MG PO TABS
10.0000 mg | ORAL_TABLET | Freq: Three times a day (TID) | ORAL | 3 refills | Status: DC | PRN
Start: 2023-06-19 — End: 2023-08-29

## 2023-06-19 MED ORDER — FLUOXETINE HCL 40 MG PO CAPS: 40.0000 mg | ORAL_CAPSULE | Freq: Every day | ORAL | 3 refills | Status: AC

## 2023-06-19 NOTE — Progress Notes (Signed)
 Acute Office Visit  Subjective:     Patient ID: Jennifer Shaw, female    DOB: [birth year], 23 y.o.   MRN: [id number]  Chief Complaint  Patient presents with   Anxiety    HPI Patient is in today for increased anxiety for a few weeks. Anxiety has gotten so bad that she has been paranoid for the last few weeks about various things. Mostly in social situation. Feels ok when is at home alone.  Worried about about people breaking in at night. Worries so much that she isn't able to sleep. Having night mares when when she does sleep. Having panic attacks when she has to leave the house. With panic attacks she is having racing heart, palpitations, sweating, lightheaded, weakness, hot, flush, dissociated. No hallucinations. Feels worried that someone or something will hurt her when she leaves the house.   Restarted prozac  a 3 weeks ago with no improvement.      06/19/2023    2:52 PM 12/05/2022    1:27 PM 10/23/2022    9:42 AM  Depression screen PHQ 2/9  Decreased Interest 1 1 2   Down, Depressed, Hopeless 2 0 0  PHQ - 2 Score 3 1 2   Altered sleeping 3 2 3   Tired, decreased energy 2 0 3  Change in appetite 3 0 2  Feeling bad or failure about yourself  0 0 0  Trouble concentrating 0 0 3  Moving slowly or fidgety/restless 0 0 0  Suicidal thoughts 0 0 0  PHQ-9 Score 11 3 13   Difficult doing work/chores Somewhat difficult Not difficult at all Somewhat difficult      06/19/2023    2:53 PM 12/05/2022    1:27 PM 10/23/2022    9:42 AM 04/24/2022    9:25 AM  GAD 7 : Generalized Anxiety Score  Nervous, Anxious, on Edge 3 0 3 0  Control/stop worrying 3 0 2 0  Worry too much - different things 3 0 3 0  Trouble relaxing 3 0 3 0  Restless 2 0 3 0  Easily annoyed or irritable 3 0 3 0  Afraid - awful might happen 3 0 0 0  Total GAD 7 Score 20 0 17 0  Anxiety Difficulty Extremely difficult Not difficult at all Very difficult Not difficult at all     ROS As per HPI.      Objective:    BP  113/69   Pulse 83   Temp 98.9 F (37.2 C) (Temporal)   Ht 5\' 7"  (1.702 m)   Wt 185 lb 9.6 oz (84.2 kg)   SpO2 96%   BMI 29.07 kg/m    Physical Exam Vitals and nursing note reviewed.  Constitutional:      General: She is not in acute distress.    Appearance: She is not ill-appearing, toxic-appearing or diaphoretic.  Neck:     Thyroid : No thyroid  mass, thyromegaly or thyroid  tenderness.  Cardiovascular:     Rate and Rhythm: Normal rate and regular rhythm.     Heart sounds: Normal heart sounds. No murmur heard. Pulmonary:     Effort: Pulmonary effort is normal.     Breath sounds: Normal breath sounds.  Abdominal:     General: Bowel sounds are normal. There is no distension.     Palpations: Abdomen is soft.     Tenderness: There is no abdominal tenderness. There is no guarding or rebound.  Musculoskeletal:     Cervical back: Neck supple.  Skin:  General: Skin is warm and dry.  Neurological:     General: No focal deficit present.     Mental Status: She is alert and oriented to person, place, and time.  Psychiatric:        Mood and Affect: Mood normal.        Behavior: Behavior normal.     No results found for any visits on 06/19/23.      Assessment & Plan:   Mycala was seen today for anxiety.  Diagnoses and all orders for this visit:  Generalized anxiety disorder Uncontrolled. Increase prozac  to 40 mg daily. Start propranolol TID prn.  -     propranolol (INDERAL) 10 MG tablet; Take 1 tablet (10 mg total) by mouth 3 (three) times daily as needed (racing heart). -     FLUoxetine  (PROZAC ) 40 MG capsule; Take 1 capsule (40 mg total) by mouth daily.  Depression, major, recurrent, mild (HCC) Uncontrolled. Denies SI.  -     FLUoxetine  (PROZAC ) 40 MG capsule; Take 1 capsule (40 mg total) by mouth daily.  Acquired hypothyroidism Will recheck TSH today. She reports compliance with levothyroxine  daily.  -     TSH + free T4    Return in about 2 weeks (around  07/03/2023), or if symptoms worsen or fail to improve, for medication follow up.  The patient indicates understanding of these issues and agrees with the plan.  Albertha Huger, FNP

## 2023-06-19 NOTE — Telephone Encounter (Signed)
  Chief Complaint: anxiety, panic Symptoms: anxiety, feeling of passing out, hot flashes Frequency: chronic- intermittent Pertinent Negatives: Patient denies being unsafe Disposition: [] ED /[] Urgent Care (no appt availability in office) / [x] Appointment(In office/virtual)/ []  [place] [facility]/ [] Home Care/ [] Refused Recommended Disposition /[] [facility]/ []  Follow-up with PCP Additional Notes:  Takes Prozac  medication as usual, sometimes she "will forget", she had not been taking them for sometime and she restarted daily Prozac  one week ago, today is 9th day back on. The medication is not working as it normally dose. She is having panic attacks when in public and when needing to talk with people. She is struggling to manage her anxiety at [employer] she works at. She has been splashing her face with water and laying down which used to help but is no longer helpful. Everytime she goes somewhere she has overwhelming anxiety, will begin to feel hot, will feel she is going to pass out. She has not passed out. She reports she is paranoid, 'I don't trust anyone", 'I will not eat food prepared by others, I think they are putting something in it". 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  She feels safe at this time. Acute scheduled with PCP today. Educated on care advice as documented in protocol, patient verbalized understanding. Discussed reasons to call back or call for EMS.    Copied from CRM [id number]. Topic: Clinical - Red Word Triage >> Jun 19, 2023 10:04 AM Turkey B wrote: [facility] that prompted transfer to Nurse Triage: pt has anxiety , shakes and fears something is gonna happen at anytime Reason for Disposition  Patient sounds very upset or troubled to the triager  Protocols used: Anxiety and Panic Attack-A-AH

## 2023-06-20 ENCOUNTER — Encounter: Payer: Self-pay | Admitting: Family Medicine

## 2023-06-20 LAB — TSH+FREE T4
Free T4: 1.16 ng/dL (ref 0.82–1.77)
TSH: 4.8 u[IU]/mL — ABNORMAL HIGH (ref 0.450–4.500)

## 2023-06-26 ENCOUNTER — Telehealth: Payer: Self-pay | Admitting: Family Medicine

## 2023-06-26 DIAGNOSIS — E039 Hypothyroidism, unspecified: Secondary | ICD-10-CM

## 2023-06-26 NOTE — Telephone Encounter (Signed)
 Copied from CRM [id number]. Topic: Clinical - Medication Refill >> Jun 26, 2023  4:31 PM Tiffany H wrote: Medication:  levothyroxine  (SYNTHROID ) 50 MCG tablet [[id number]]  Has the patient contacted their pharmacy? Yes (Agent: If no, request that the patient contact the pharmacy for the refill. If patient does not wish to contact the pharmacy document the reason why and proceed with request.) (Agent: If yes, when and what did the pharmacy advise?)  This is the patient's preferred pharmacy:   [id number] - [place], [place] - [address] AT [facility] [address]  [place] [address] Phone: [phone] Fax: [phone]  Is this the correct pharmacy for this prescription? Yes If no, delete pharmacy and type the correct one.   Has the prescription been filled recently? No  Is the patient out of the medication? Yes  Has the patient been seen for an appointment in the last year OR does the patient have an upcoming appointment? Yes  Can we respond through MyChart? Yes  Agent: Please be advised that Rx refills may take up to 3 business days. We ask that you follow-up with your pharmacy.

## 2023-06-26 NOTE — Telephone Encounter (Signed)
 Tsh level off do we need to change it?

## 2023-06-27 MED ORDER — LEVOTHYROXINE SODIUM 50 MCG PO TABS: 50.0000 ug | ORAL_TABLET | Freq: Every day | ORAL | 3 refills | Status: DC

## 2023-06-27 NOTE — Telephone Encounter (Signed)
 This [facility] is [facility] to [facility] to [facility]. It was sent to the wrong pharmacy and pt is quite frustrated. Can this please be sent to the correct pharmacy asap?

## 2023-06-27 NOTE — Telephone Encounter (Signed)
 Levothyroxine  sent to the correct pharmacy and pt is aware.

## 2023-06-27 NOTE — Addendum Note (Signed)
 Addended by: Genene Kennel on: 06/27/2023 01:13 PM   Modules accepted: Orders

## 2023-06-27 NOTE — Telephone Encounter (Signed)
 Levothyroxine  Prescription sent to pharmacy, per result note continue current dose.

## 2023-07-04 ENCOUNTER — Encounter: Payer: Self-pay | Admitting: Family Medicine

## 2023-07-04 ENCOUNTER — Ambulatory Visit (INDEPENDENT_AMBULATORY_CARE_PROVIDER_SITE_OTHER): Admitting: Family Medicine

## 2023-07-04 VITALS — BP 125/80 | HR 76 | Temp 97.9°F | Ht 67.0 in | Wt 181.6 lb

## 2023-07-04 DIAGNOSIS — F411 Generalized anxiety disorder: Secondary | ICD-10-CM

## 2023-07-04 DIAGNOSIS — F330 Major depressive disorder, recurrent, mild: Secondary | ICD-10-CM | POA: Diagnosis not present

## 2023-07-04 DIAGNOSIS — F410 Panic disorder [episodic paroxysmal anxiety] without agoraphobia: Secondary | ICD-10-CM | POA: Insufficient documentation

## 2023-07-04 NOTE — Progress Notes (Signed)
 Established Patient Office Visit  Subjective   Patient ID: Jennifer Shaw, female    DOB: [birth year]  Age: 23 y.o. MRN: [id number]  Chief Complaint  Patient presents with   Medical Management of Chronic Issues    HPI Jennifer Shaw is here for a follow up of anxiety and depression. Prozac  was increased to 40 mg 2 weeks ago. She also started propranolol  TID prn. She reports and improvement in symptoms. 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 She is also socializing with some friends. She has increased her time outside and decreased her time on her phone. She is still having panic attacks but less frequently. The propranolol  is helpful when she takes it before a known triggering event. She is interested in counseling. Denies SI.      07/04/2023    2:24 PM 06/19/2023    2:52 PM 12/05/2022    1:27 PM  Depression screen PHQ 2/9  Decreased Interest 1 1 1   Down, Depressed, Hopeless 0 2 0  PHQ - 2 Score 1 3 1   Altered sleeping 0 3 2  Tired, decreased energy 2 2 0  Change in appetite 2 3 0  Feeling bad or failure about yourself  0 0 0  Trouble concentrating 0 0 0  Moving slowly or fidgety/restless 0 0 0  Suicidal thoughts 0 0 0  PHQ-9 Score 5 11 3   Difficult doing work/chores Somewhat difficult Somewhat difficult Not difficult at all      07/04/2023    2:25 PM 06/19/2023    2:53 PM 12/05/2022    1:27 PM 10/23/2022    9:42 AM  GAD 7 : Generalized Anxiety Score  Nervous, Anxious, on Edge 2 3 0 3  Control/stop worrying 0 3 0 2  Worry too much - different things 0 3 0 3  Trouble relaxing 0 3 0 3  Restless 0 2 0 3  Easily annoyed or irritable 0 3 0 3  Afraid - awful might happen 0 3 0 0  Total GAD 7 Score 2 20 0 17  Anxiety Difficulty Not difficult at all Extremely difficult Not difficult at all Very difficult       ROS As per HPI.    Objective:     BP 125/80   Pulse 76   Temp 97.9 F (36.6 C) (Temporal)   Ht 5\' 7"   (1.702 m)   Wt 181 lb 9.6 oz (82.4 kg)   SpO2 99%   BMI 28.44 kg/m    Physical Exam Vitals and nursing note reviewed.  Constitutional:      General: She is not in acute distress.    Appearance: She is not ill-appearing, toxic-appearing or diaphoretic.  Pulmonary:     Effort: Pulmonary effort is normal. No respiratory distress.  Musculoskeletal:     Right lower leg: No edema.     Left lower leg: No edema.  Skin:    General: Skin is warm and dry.  Neurological:     General: No focal deficit present.     Mental Status: She is alert and oriented to person, place, and time.  Psychiatric:        Mood and Affect: Mood normal.        Behavior: Behavior normal.      No results found for any visits on 07/04/23.    The ASCVD Risk score (Arnett DK, et al., 2019) failed to calculate for the  following reasons:   The 2019 ASCVD risk score is only valid for ages 42 to 47    Assessment & Plan:   Jennifer Shaw was seen today for medical management of chronic issues.  Diagnoses and all orders for this visit:  Panic disorder -     Ambulatory referral to Psychology  Generalized anxiety disorder -     Ambulatory referral to Psychology  Depression, major, recurrent, mild (HCC) -     Ambulatory referral to Psychology   Improving. Continue prozac  40 mg and propranolol  TID prn. Referral for counseling discussed and placed.    Return in about 4 weeks (around 08/01/2023) for medication follow up.   The patient indicates understanding of these issues and agrees with the plan.  Jennifer Huger, FNP

## 2023-08-01 ENCOUNTER — Encounter: Payer: Self-pay | Admitting: Family Medicine

## 2023-08-01 ENCOUNTER — Ambulatory Visit: Admitting: Family Medicine

## 2023-08-01 VITALS — BP 104/65 | HR 73 | Temp 98.6°F | Ht 67.0 in | Wt 178.0 lb

## 2023-08-01 DIAGNOSIS — F411 Generalized anxiety disorder: Secondary | ICD-10-CM

## 2023-08-01 DIAGNOSIS — G43709 Chronic migraine without aura, not intractable, without status migrainosus: Secondary | ICD-10-CM | POA: Diagnosis not present

## 2023-08-01 DIAGNOSIS — F410 Panic disorder [episodic paroxysmal anxiety] without agoraphobia: Secondary | ICD-10-CM

## 2023-08-01 DIAGNOSIS — F330 Major depressive disorder, recurrent, mild: Secondary | ICD-10-CM

## 2023-08-01 DIAGNOSIS — R61 Generalized hyperhidrosis: Secondary | ICD-10-CM | POA: Diagnosis not present

## 2023-08-01 MED ORDER — DRYSOL 20 % EX SOLN: Freq: Every day | CUTANEOUS | 0 refills | Status: DC

## 2023-08-01 MED ORDER — TOPIRAMATE 25 MG PO TABS: 25.0000 mg | ORAL_TABLET | Freq: Every day | ORAL | 1 refills | Status: DC

## 2023-08-01 NOTE — Patient Instructions (Signed)
 [phone]  [phone] 

## 2023-08-01 NOTE — Progress Notes (Unsigned)
   Established Patient Office Visit  Subjective   Patient ID: Jennifer Shaw, female    DOB: 2000/05/03  Age: 23 y.o. MRN: [id number]  Chief Complaint  Patient presents with   Medical Management of Chronic Issues    HPI Jennifer Shaw is here for a follow up of anxiety and depression.       08/01/2023    3:03 PM 07/04/2023    2:24 PM 06/19/2023    2:52 PM  Depression screen PHQ 2/9  Decreased Interest 1 1 1   Down, Depressed, Hopeless 0 0 2  PHQ - 2 Score 1 1 3   Altered sleeping 0 0 3  Tired, decreased energy 2 2 2   Change in appetite 0 2 3  Feeling bad or failure about yourself  0 0 0  Trouble concentrating 0 0 0  Moving slowly or fidgety/restless 0 0 0  Suicidal thoughts 0 0 0  PHQ-9 Score 3 5 11   Difficult doing work/chores Not difficult at all Somewhat difficult Somewhat difficult      08/01/2023    3:03 PM 07/04/2023    2:25 PM 06/19/2023    2:53 PM 12/05/2022    1:27 PM  GAD 7 : Generalized Anxiety Score  Nervous, Anxious, on Edge 1 2 3  0  Control/stop worrying 1 0 3 0  Worry too much - different things 0 0 3 0  Trouble relaxing 2 0 3 0  Restless 0 0 2 0  Easily annoyed or irritable 3 0 3 0  Afraid - awful might happen 0 0 3 0  Total GAD 7 Score 7 2 20  0  Anxiety Difficulty Not difficult at all Not difficult at all Extremely difficult Not difficult at all       ROS As per HPI.    Objective:     BP 104/65   Pulse 73   Temp 98.6 F (37 C) (Temporal)   Ht 5' 7 (1.702 m)   Wt 178 lb (80.7 kg)   SpO2 96%   BMI 27.88 kg/m    Physical Exam Vitals and nursing note reviewed.  Constitutional:      General: She is not in acute distress.    Appearance: She is not ill-appearing, toxic-appearing or diaphoretic.  Pulmonary:     Effort: Pulmonary effort is normal. No respiratory distress.   Musculoskeletal:     Right lower leg: No edema.     Left lower leg: No edema.   Skin:    General: Skin is warm and dry.   Neurological:     General: No focal deficit  present.     Mental Status: She is alert and oriented to person, place, and time.   Psychiatric:        Mood and Affect: Mood normal.        Behavior: Behavior normal.      No results found for any visits on 08/01/23.    The ASCVD Risk score (Arnett DK, et al., 2019) failed to calculate for the following reasons:   The 2019 ASCVD risk score is only valid for ages 1 to 78    Assessment & Plan:     No follow-ups on file.   The patient indicates understanding of these issues and agrees with the plan.  Albertha Huger, FNP

## 2023-08-05 ENCOUNTER — Other Ambulatory Visit: Payer: Self-pay | Admitting: *Deleted

## 2023-08-05 ENCOUNTER — Telehealth: Payer: Self-pay

## 2023-08-05 DIAGNOSIS — G43709 Chronic migraine without aura, not intractable, without status migrainosus: Secondary | ICD-10-CM

## 2023-08-05 DIAGNOSIS — R61 Generalized hyperhidrosis: Secondary | ICD-10-CM

## 2023-08-05 MED ORDER — TOPIRAMATE 25 MG PO TABS: 25.0000 mg | ORAL_TABLET | Freq: Every day | ORAL | 1 refills | Status: DC

## 2023-08-05 MED ORDER — DRYSOL 20 % EX SOLN: Freq: Every day | CUTANEOUS | 0 refills | Status: DC

## 2023-08-05 NOTE — Telephone Encounter (Signed)
 Copied from CRM [id number]. Topic: Clinical - Prescription Issue >> Aug 05, 2023 12:32 PM Tiffany H wrote: Reason for CRM: Patient called to request medication be sent to a different pharmacy. Patient is no longer having medication sent to [facility]. Please remove from all future requests.   aluminum chloride (DRYSOL) 20 % external solution [[id number]] topiramate  (TOPAMAX ) 25 MG tablet [[id number]]   [id number] - [place],  - [address] AT [facility] [address], [place] [address] Phone: [phone]  Fax: [phone] DEA #: [id number]

## 2023-08-05 NOTE — Telephone Encounter (Signed)
 Pharmacy has been changed in MR.  Patient contacted. She states she has not picked up the Rx's from [facility] and would [facility] [place].  [facility] Called [facility] and cancelled scripts

## 2023-08-28 ENCOUNTER — Ambulatory Visit: Payer: Self-pay

## 2023-08-28 NOTE — Telephone Encounter (Signed)
 Left detailed message per signed DPR. Encouraged call back or send us  a Mychart message if there are any questions.

## 2023-08-28 NOTE — Telephone Encounter (Signed)
 FYI Only or Action Required?: Action required by provider: clinical question for provider and lab request.  Patient was last seen in primary care on 08/01/2023 by Joesph Annabella HERO, FNP.  Called Nurse Triage reporting Medication in pregnancy.  Symptoms began NA.  Interventions attempted: Other: NA.  Symptoms are: NA.  Triage Disposition: Call PCP Now  Patient/caregiver understands and will follow disposition?: Yes     Copied from CRM [id number]. Topic: Clinical - Red Word Triage >> Aug 28, 2023 10:19 AM Mia F wrote: Red Word that prompted transfer to Nurse Triage: Patient is calling in because she just took a pregnancy test and it came back positive. She is concerned about the medications she is taking possibly being dangerous to take while pregnant. Transferring to NT to assist. Medications are topiramate  (TOPAMAX ) 25 MG tablet, levothyroxine  (SYNTHROID ) 50 MCG tablet, and FLUoxetine  (PROZAC ) 40 MG capsule. Reason for Disposition  [1] Caller has URGENT medicine question about med that primary care doctor (or NP/PA) or specialist prescribed AND [2] triager unable to answer question  Answer Assessment - Initial Assessment Questions 1. NAME of MEDICINE: What medicine(s) are you calling about?     Synthroid , Topamax , Prozac  2. QUESTION: What is your question? (e.g., double dose of medicine, side effect)     Are these safe in pregnancy  3. SYMPTOMS: Do you have any symptoms? If Yes, ask: What symptoms are you having?  How bad are the symptoms (e.g., mild, moderate, severe)     denies 4. PREGNANCY:  Is there any chance that you are pregnant? When was your last menstrual period?     Yes, positive home test today  Additional info: 1) Advised ok for Synthroid , hold on Topamax  and Prozac  until follow up by pcp.   2) Requesting order for hcg testing.   3) Has not contacted an ob/gyn at this time  Protocols used: Medication Question Call-A-AH

## 2023-08-28 NOTE — Telephone Encounter (Addendum)
 Stop topamax  and propranolol . Continue prozac  and levothyroxine . Schedule appt to be seen.

## 2023-08-29 ENCOUNTER — Encounter: Payer: Self-pay | Admitting: Family Medicine

## 2023-08-29 ENCOUNTER — Ambulatory Visit: Admitting: Family Medicine

## 2023-08-29 VITALS — BP 112/80 | HR 85 | Ht 67.0 in | Wt 175.0 lb

## 2023-08-29 DIAGNOSIS — E039 Hypothyroidism, unspecified: Secondary | ICD-10-CM | POA: Diagnosis not present

## 2023-08-29 DIAGNOSIS — N912 Amenorrhea, unspecified: Secondary | ICD-10-CM

## 2023-08-29 LAB — PREGNANCY, URINE: Preg Test, Ur: POSITIVE — AB

## 2023-08-29 NOTE — Progress Notes (Signed)
 BP 112/80   Pulse 85   Ht 5' 7 (1.702 m)   Wt 175 lb (79.4 kg)   LMP 07/18/2023   SpO2 99%   BMI 27.41 kg/m    Subjective:   Patient ID: Jennifer Shaw, female    DOB: [birth year], 22 y.o.   MRN: [id number]  HPI: Jennifer Shaw is a 23 y.o. female presenting on 08/29/2023 for Amenorrhea   HPI Positive urine pregnancy Patient has positive urine pregnancy and her last menstrual period was on June 6 till June 10.  She says that this is unplanned pregnancy.  She is in a relationship and they have been living together for 8 months and she says most part is good relationship.  She denies any urinary symptoms or vaginal symptoms.  She does have some lower abdominal cramping  Relevant past medical, surgical, family and social history reviewed and updated as indicated. Interim medical history since our last visit reviewed. Allergies and medications reviewed and updated.  Review of Systems  Constitutional:  Negative for chills and fever.  HENT:  Negative for congestion, ear discharge and ear pain.   Eyes:  Negative for visual disturbance.  Respiratory:  Negative for chest tightness and shortness of breath.   Cardiovascular:  Negative for chest pain and leg swelling.  Gastrointestinal:  Positive for abdominal pain.  Genitourinary:  Negative for difficulty urinating and dysuria.  Musculoskeletal:  Negative for back pain and gait problem.  Skin:  Negative for rash.  Neurological:  Negative for light-headedness and headaches.  Psychiatric/Behavioral:  Negative for agitation and behavioral problems.   All other systems reviewed and are negative.   Per HPI unless specifically indicated above   Allergies as of 08/29/2023   No Known Allergies      Medication List        Accurate as of August 29, 2023 10:40 AM. If you have any questions, ask your nurse or doctor.          STOP taking these medications    propranolol  10 MG tablet Commonly known as: INDERAL  Stopped by: [facility]  Jeptha Hinnenkamp   topiramate  25 MG tablet Commonly known as: Topamax  Stopped by: [facility] Angelus Hoopes       TAKE these medications    Drysol 20 % external solution Generic drug: aluminum chloride Apply topically at bedtime.   FLUoxetine  40 MG capsule Commonly known as: PROZAC  Take 1 capsule (40 mg total) by mouth daily.   levothyroxine  50 MCG tablet Commonly known as: SYNTHROID  Take 1 tablet (50 mcg total) by mouth daily before breakfast.         Objective:   BP 112/80   Pulse 85   Ht 5' 7 (1.702 m)   Wt 175 lb (79.4 kg)   LMP 07/18/2023   SpO2 99%   BMI 27.41 kg/m   Wt Readings from Last 3 Encounters:  08/29/23 175 lb (79.4 kg)  08/01/23 178 lb (80.7 kg)  07/04/23 181 lb 9.6 oz (82.4 kg)    Physical Exam Vitals and nursing note reviewed.  Constitutional:      General: She is not in acute distress.    Appearance: She is well-developed. She is not diaphoretic.  Eyes:     Conjunctiva/sclera: Conjunctivae normal.  Abdominal:     General: Abdomen is flat. Bowel sounds are normal. There is no distension.     Palpations: Abdomen is soft. There is no mass.     Tenderness: There is no abdominal tenderness. There is no  guarding or rebound.     Hernia: No hernia is present.  Musculoskeletal:        General: No tenderness. Normal range of motion.  Skin:    General: Skin is warm and dry.     Findings: No rash.  Neurological:     Mental Status: She is alert and oriented to person, place, and time.     Coordination: Coordination normal.  Psychiatric:        Behavior: Behavior normal.       Assessment & Plan:   Problem List Items Addressed This Visit       Endocrine   Acquired hypothyroidism   Relevant Orders   Thyroid  Panel With TSH   Ambulatory referral to Obstetrics / Gynecology   Other Visit Diagnoses       Amenorrhea    -  Primary   Relevant Orders   Pregnancy, urine   Ambulatory referral to Obstetrics / Gynecology       Patient's going to  continue with her thyroid  dose currently but will check it today and see if we have to adjust it.  Will give a printout of medications that are safe during pregnancy Follow up plan: Return if symptoms worsen or fail to improve.  Counseling provided for all of the vaccine components Orders Placed This Encounter  Procedures   Pregnancy, urine   Thyroid  Panel With TSH   Ambulatory referral to Obstetrics / Gynecology    Fonda Levins, MD [facility] 08/29/2023, 10:40 AM

## 2023-08-30 LAB — THYROID PANEL WITH TSH
Free Thyroxine Index: 2 (ref 1.2–4.9)
T3 Uptake Ratio: 30 % (ref 24–39)
T4, Total: 6.5 ug/dL (ref 4.5–12.0)
TSH: 2.62 u[IU]/mL (ref 0.450–4.500)

## 2023-09-04 ENCOUNTER — Ambulatory Visit: Payer: Self-pay | Admitting: Family Medicine

## 2023-09-11 ENCOUNTER — Other Ambulatory Visit: Payer: Self-pay | Admitting: Obstetrics & Gynecology

## 2023-09-11 DIAGNOSIS — O3680X0 Pregnancy with inconclusive fetal viability, not applicable or unspecified: Secondary | ICD-10-CM

## 2023-09-12 ENCOUNTER — Other Ambulatory Visit

## 2023-09-12 DIAGNOSIS — Z3A08 8 weeks gestation of pregnancy: Secondary | ICD-10-CM

## 2023-09-12 DIAGNOSIS — Z3491 Encounter for supervision of normal pregnancy, unspecified, first trimester: Secondary | ICD-10-CM | POA: Diagnosis not present

## 2023-09-12 DIAGNOSIS — O3680X0 Pregnancy with inconclusive fetal viability, not applicable or unspecified: Secondary | ICD-10-CM

## 2023-09-12 NOTE — Progress Notes (Signed)
 US  5+6 wks,single IUP with yolk sac,CRL 2.89 mm,FHR 107 bpm,normal right ovary,left ovary not visualized

## 2023-09-15 ENCOUNTER — Encounter: Payer: Self-pay | Admitting: Family Medicine

## 2023-09-15 ENCOUNTER — Ambulatory Visit: Admitting: Family Medicine

## 2023-09-30 ENCOUNTER — Encounter: Payer: Self-pay | Admitting: Advanced Practice Midwife

## 2023-10-01 ENCOUNTER — Ambulatory Visit (INDEPENDENT_AMBULATORY_CARE_PROVIDER_SITE_OTHER): Admitting: Advanced Practice Midwife

## 2023-10-01 ENCOUNTER — Encounter: Payer: Self-pay | Admitting: Advanced Practice Midwife

## 2023-10-01 ENCOUNTER — Ambulatory Visit: Admitting: *Deleted

## 2023-10-01 VITALS — BP 111/75 | HR 78 | Wt 190.0 lb

## 2023-10-01 DIAGNOSIS — Z3401 Encounter for supervision of normal first pregnancy, first trimester: Secondary | ICD-10-CM

## 2023-10-01 DIAGNOSIS — Z3400 Encounter for supervision of normal first pregnancy, unspecified trimester: Secondary | ICD-10-CM | POA: Insufficient documentation

## 2023-10-01 DIAGNOSIS — Z3A08 8 weeks gestation of pregnancy: Secondary | ICD-10-CM

## 2023-10-01 MED ORDER — BLOOD PRESSURE MONITOR MISC: 0 refills | Status: AC

## 2023-10-01 NOTE — Patient Instructions (Signed)
 Jennifer Shaw, thank you for choosing our office today! We appreciate the opportunity to meet your healthcare needs. You may receive a short survey by mail, e-mail, or through [employer]. If you are happy with your care we would appreciate if you could take just a few minutes to complete the survey questions. We read all of your comments and take your feedback very seriously. Thank you again for choosing our office.  [facility] [employer] [facility] at [facility]  [facility] at [facility] ([address] [place], [place] [address]) Entrance C, located off of [address] Free 24/7 valet parking   Nausea & Vomiting Have saltine crackers or pretzels by your bed and eat a few bites before you raise your head out of bed in the morning Eat small frequent meals throughout the day instead of large meals Drink plenty of fluids throughout the day to stay hydrated, just don't drink a lot of fluids with your meals.  This can make your stomach fill up faster making you feel sick Do not brush your teeth right after you eat Products with real ginger are good for nausea, like ginger ale and ginger hard candy Make sure it says made with real ginger! Sucking on sour candy like lemon heads is also good for nausea If your prenatal vitamins make you nauseated, take them at night so you will sleep through the nausea Sea Bands If you feel like you need medicine for the nausea & vomiting please let us  know If you are unable to keep any fluids or food down please let us  know   Constipation Drink plenty of fluid, preferably water, throughout the day Eat foods high in fiber such as fruits, vegetables, and grains Exercise, such as walking, is a good way to keep your bowels regular Drink warm fluids, especially warm prune juice, or decaf coffee Eat a 1/2 cup of real oatmeal (not instant), 1/2 cup applesauce, and 1/2-1 cup warm prune juice every day If needed, you may take Colace (docusate sodium) stool softener  once or twice a day to help keep the stool soft.  If you still are having problems with constipation, you may take Miralax  once daily as needed to help keep your bowels regular.   Home Blood Pressure Monitoring for Patients   Your provider has recommended that you check your blood pressure (BP) at least once a week at home. If you do not have a blood pressure cuff at home, one will be provided for you. Contact your provider if you have not received your monitor within 1 week.   Helpful Tips for Accurate Home Blood Pressure Checks  Don't smoke, exercise, or drink caffeine 30 minutes before checking your BP Use the restroom before checking your BP (a full bladder can raise your pressure) Relax in a comfortable upright chair Feet on the ground Left arm resting comfortably on a flat surface at the level of your heart Legs uncrossed Back supported Sit quietly and don't talk Place the cuff on your bare arm Adjust snuggly, so that only two fingertips can fit between your skin and the top of the cuff Check 2 readings separated by at least one minute Keep a log of your BP readings For a visual, please reference this diagram: http://ccnc.care/bpdiagram  Provider Name: [facility]     Phone: [phone]  Zone 1: ALL CLEAR  Continue to monitor your symptoms:  BP reading is less than 140 (top number) or less than 90 (bottom  number)  No right upper stomach pain No headaches or seeing spots No feeling nauseated or throwing up No swelling in face and hands  Zone 2: CAUTION Call your doctor's office for any of the following:  BP reading is greater than 140 (top number) or greater than 90 (bottom number)  Stomach pain under your ribs in the middle or right side Headaches or seeing spots Feeling nauseated or throwing up Swelling in face and hands  Zone 3: EMERGENCY  Seek immediate medical care if you have any of the following:  BP reading is greater than160 (top number) or greater than  110 (bottom number) Severe headaches not improving with Tylenol Serious difficulty catching your breath Any worsening symptoms from Zone 2    First Trimester of Pregnancy The first trimester of pregnancy is from week 1 until the end of week 12 (months 1 through 3). A week after a sperm fertilizes an egg, the egg will implant on the wall of the uterus. This embryo will begin to develop into a baby. Genes from you and your partner are forming the baby. The female genes determine whether the baby is a boy or a girl. At 6-8 weeks, the eyes and face are formed, and the heartbeat can be seen on ultrasound. At the end of 12 weeks, all the baby's organs are formed.  Now that you are pregnant, you will want to do everything you can to have a healthy baby. Two of the most important things are to get good prenatal care and to follow your health care provider's instructions. Prenatal care is all the medical care you receive before the baby's birth. This care will help prevent, find, and treat any problems during the pregnancy and childbirth. BODY CHANGES Your body goes through many changes during pregnancy. The changes vary from woman to woman.  You may gain or lose a couple of pounds at first. You may feel sick to your stomach (nauseous) and throw up (vomit). If the vomiting is uncontrollable, call your health care provider. You may tire easily. You may develop headaches that can be relieved by medicines approved by your health care provider. You may urinate more often. Painful urination may mean you have a bladder infection. You may develop heartburn as a result of your pregnancy. You may develop constipation because certain hormones are causing the muscles that push waste through your intestines to slow down. You may develop hemorrhoids or swollen, bulging veins (varicose veins). Your breasts may begin to grow larger and become tender. Your nipples may stick out more, and the tissue that surrounds them  (areola) may become darker. Your gums may bleed and may be sensitive to brushing and flossing. Dark spots or blotches (chloasma, mask of pregnancy) may develop on your face. This will likely fade after the baby is born. Your menstrual periods will stop. You may have a loss of appetite. You may develop cravings for certain kinds of food. You may have changes in your emotions from day to day, such as being excited to be pregnant or being concerned that something may go wrong with the pregnancy and baby. You may have more vivid and strange dreams. You may have changes in your hair. These can include thickening of your hair, rapid growth, and changes in texture. Some women also have hair loss during or after pregnancy, or hair that feels dry or thin. Your hair will most likely return to normal after your baby is born. WHAT TO EXPECT AT YOUR PRENATAL  VISITS During a routine prenatal visit: You will be weighed to make sure you and the baby are growing normally. Your blood pressure will be taken. Your abdomen will be measured to track your baby's growth. The fetal heartbeat will be listened to starting around week 10 or 12 of your pregnancy. Test results from any previous visits will be discussed. Your health care provider may ask you: How you are feeling. If you are feeling the baby move. If you have had any abnormal symptoms, such as leaking fluid, bleeding, severe headaches, or abdominal cramping. If you have any questions. Other tests that may be performed during your first trimester include: Blood tests to find your blood type and to check for the presence of any previous infections. They will also be used to check for low iron levels (anemia) and Rh antibodies. Later in the pregnancy, blood tests for diabetes will be done along with other tests if problems develop. Urine tests to check for infections, diabetes, or protein in the urine. An ultrasound to confirm the proper growth and development  of the baby. An amniocentesis to check for possible genetic problems. Fetal screens for spina bifida and Down syndrome. You may need other tests to make sure you and the baby are doing well. HOME CARE INSTRUCTIONS  Medicines Follow your health care provider's instructions regarding medicine use. Specific medicines may be either safe or unsafe to take during pregnancy. Take your prenatal vitamins as directed. If you develop constipation, try taking a stool softener if your health care provider approves. Diet Eat regular, well-balanced meals. Choose a variety of foods, such as meat or vegetable-based protein, fish, milk and low-fat dairy products, vegetables, fruits, and whole grain breads and cereals. Your health care provider will help you determine the amount of weight gain that is right for you. Avoid raw meat and uncooked cheese. These carry germs that can cause birth defects in the baby. Eating four or five small meals rather than three large meals a day may help relieve nausea and vomiting. If you start to feel nauseous, eating a few soda crackers can be helpful. Drinking liquids between meals instead of during meals also seems to help nausea and vomiting. If you develop constipation, eat more high-fiber foods, such as fresh vegetables or fruit and whole grains. Drink enough fluids to keep your urine clear or pale yellow. Activity and Exercise Exercise only as directed by your health care provider. Exercising will help you: Control your weight. Stay in shape. Be prepared for labor and delivery. Experiencing pain or cramping in the lower abdomen or low back is a good sign that you should stop exercising. Check with your health care provider before continuing normal exercises. Try to avoid standing for long periods of time. Move your legs often if you must stand in one place for a long time. Avoid heavy lifting. Wear low-heeled shoes, and practice good posture. You may continue to have sex  unless your health care provider directs you otherwise. Relief of Pain or Discomfort Wear a good support bra for breast tenderness.   Take warm sitz baths to soothe any pain or discomfort caused by hemorrhoids. Use hemorrhoid cream if your health care provider approves.   Rest with your legs elevated if you have leg cramps or low back pain. If you develop varicose veins in your legs, wear support hose. Elevate your feet for 15 minutes, 3-4 times a day. Limit salt in your diet. Prenatal Care Schedule your prenatal visits by the  twelfth week of pregnancy. They are usually scheduled monthly at first, then more often in the last 2 months before delivery. Write down your questions. Take them to your prenatal visits. Keep all your prenatal visits as directed by your health care provider. Safety Wear your seat belt at all times when driving. Make a list of emergency phone numbers, including numbers for family, friends, the hospital, and police and fire departments. General Tips Ask your health care provider for a referral to a local prenatal education class. Begin classes no later than at the beginning of month 6 of your pregnancy. Ask for help if you have counseling or nutritional needs during pregnancy. Your health care provider can offer advice or refer you to specialists for help with various needs. Do not use hot tubs, steam rooms, or saunas. Do not douche or use tampons or scented sanitary pads. Do not cross your legs for long periods of time. Avoid cat litter boxes and soil used by cats. These carry germs that can cause birth defects in the baby and possibly loss of the fetus by miscarriage or stillbirth. Avoid all smoking, herbs, alcohol, and medicines not prescribed by your health care provider. Chemicals in these affect the formation and growth of the baby. Schedule a dentist appointment. At home, brush your teeth with a soft toothbrush and be gentle when you floss. SEEK MEDICAL CARE IF:   You have dizziness. You have mild pelvic cramps, pelvic pressure, or nagging pain in the abdominal area. You have persistent nausea, vomiting, or diarrhea. You have a bad smelling vaginal discharge. You have pain with urination. You notice increased swelling in your face, hands, legs, or ankles. SEEK IMMEDIATE MEDICAL CARE IF:  You have a fever. You are leaking fluid from your vagina. You have spotting or bleeding from your vagina. You have severe abdominal cramping or pain. You have rapid weight gain or loss. You vomit blood or material that looks like coffee grounds. You are exposed to [place] measles and have never had them. You are exposed to fifth disease or chickenpox. You develop a severe headache. You have shortness of breath. You have any kind of trauma, such as from a fall or a car accident. Document Released: 01/22/2001 Document Revised: 06/14/2013 Document Reviewed: 12/08/2012 [facility] Patient Information 2015 [place], [place]. This information is not intended to replace advice given to you by your health care provider. Make sure you discuss any questions you have with your health care provider.

## 2023-10-01 NOTE — Progress Notes (Signed)
 INITIAL OBSTETRICAL VISIT Patient name: Jennifer Shaw MRN [id number]  Date of birth: 11/14/2000 Chief Complaint:   Initial Prenatal Visit (Spotting after intercourse)  History of Present Illness:   Jennifer Shaw is a 23 y.o. G1P0 Caucasian female at [redacted]w[redacted]d by US  at 5.6 weeks with an Estimated Date of Delivery: 05/08/24 being seen today for her initial obstetrical visit.   Patient's last menstrual period was 07/18/2023. Her obstetrical history is significant for primigravida.   Today she reports feeling very irritable since stopping Prozac .  Last pap hasn't had yet.      10/01/2023    9:37 AM 08/29/2023   10:11 AM 08/01/2023    3:03 PM 07/04/2023    2:24 PM 06/19/2023    2:52 PM  Depression screen PHQ 2/9  Decreased Interest 3 1 1 1 1   Down, Depressed, Hopeless 0 0 0 0 2  PHQ - 2 Score 3 1 1 1 3   Altered sleeping 2 2 0 0 3  Tired, decreased energy 3 3 2 2 2   Change in appetite 0 0 0 2 3  Feeling bad or failure about yourself  0 0 0 0 0  Trouble concentrating 0 0 0 0 0  Moving slowly or fidgety/restless 0 0 0 0 0  Suicidal thoughts 0 0 0 0 0  PHQ-9 Score 8 6 3 5 11   Difficult doing work/chores  Not difficult at all Not difficult at all Somewhat difficult Somewhat difficult        10/01/2023    9:37 AM 08/29/2023   10:12 AM 08/01/2023    3:03 PM 07/04/2023    2:25 PM  GAD 7 : Generalized Anxiety Score  Nervous, Anxious, on Edge 0 0 1 2  Control/stop worrying 0 0 1 0  Worry too much - different things 0 0 0 0  Trouble relaxing 3 0 2 0  Restless 0 0 0 0  Easily annoyed or irritable 3 0 3 0  Afraid - awful might happen 0 0 0 0  Total GAD 7 Score 6 0 7 2  Anxiety Difficulty  Not difficult at all Not difficult at all Not difficult at all     Review of Systems:   Pertinent items are noted in HPI Denies cramping/contractions, leakage of fluid, vaginal bleeding, abnormal vaginal discharge w/ itching/odor/irritation, headaches, visual changes, shortness of breath, chest pain,  abdominal pain, severe nausea/vomiting, or problems with urination or bowel movements unless otherwise stated above.  Pertinent History Reviewed:  Reviewed past medical,surgical, social, obstetrical and family history.  Reviewed problem list, medications and allergies. OB History  Gravida Para Term Preterm AB Living  1       SAB IAB Ectopic Multiple Live Births          # Outcome Date GA Lbr Len/2nd Weight Sex Type Anes PTL Lv  1 Current            Physical Assessment:   Vitals:   10/01/23 0936  BP: 111/75  Pulse: 78  Weight: 190 lb (86.2 kg)  Body mass index is 29.76 kg/m.       Physical Examination:  General appearance - well appearing, and in no distress  Mental status - alert, oriented to person, place, and time  Psych:  She has a normal mood and affect  Skin - warm and dry, normal color, no suspicious lesions noted  Chest - effort normal, all lung fields clear to auscultation bilaterally  Heart - normal rate and regular  rhythm  Abdomen - soft, nontender; bedside u/s shows FHTs in 160s  Extremities:  No swelling or varicosities noted  Pelvic - not examined  Thin prep pap is not done (will get at NOB visit)   No results found for this or any previous visit (from the past 24 hours).  Assessment & Plan:  1) Low-Risk Pregnancy G1P0 at [redacted]w[redacted]d with an Estimated Date of Delivery: 05/08/24   2) Initial OB visit  3) Depression, stopped Prozac  with pregnancy; feeling irritable and would like to start back if possible; reviewed Category C meds and had R/B discussion  4) Hypothyroidism, stopped Synthroid  w pregnancy (was on 50mcg); encouraged to start back and we will recheck panel  5) Mild scoliosis, didn't require treatment  Meds:  Meds ordered this encounter  Medications   Blood Pressure Monitor MISC    Sig: For regular home bp monitoring during pregnancy    Dispense:  1 each    Refill:  0    Z34.81 Please mail to patient    Initial labs obtained Continue prenatal  vitamins Reviewed n/v relief measures and warning s/s to report Reviewed recommended weight gain based on pre-gravid BMI Encouraged well-balanced diet Genetic & carrier screening discussed: requests Panorama and Horizon , requests AFP Ultrasound discussed; fetal survey: requested CCNC completed> form faxed if has or is planning to apply for medicaid The nature of [facility] [employer] with multiple MDs and other Advanced Practice Providers was explained to patient; also emphasized that fellows, residents, and students are part of our team. Does not have home bp cuff. Office bp cuff given: no. Rx sent: yes. Check bp weekly, let us  know if consistently >140/90.   No indications for ASA therapy (per uptodate)  Follow-up: Return for 4wk LROB & PN1.   Orders Placed This Encounter  Procedures   Urine Culture    Jennifer Shaw [facility] 10/01/2023 9:54 AM

## 2023-10-03 LAB — URINE CULTURE

## 2023-10-29 ENCOUNTER — Other Ambulatory Visit

## 2023-10-29 ENCOUNTER — Other Ambulatory Visit (HOSPITAL_COMMUNITY)
Admission: RE | Admit: 2023-10-29 | Discharge: 2023-10-29 | Disposition: A | Discharge status: Home / Self Care | Source: Ambulatory Visit | Attending: Advanced Practice Midwife | Admitting: Advanced Practice Midwife

## 2023-10-29 ENCOUNTER — Ambulatory Visit (INDEPENDENT_AMBULATORY_CARE_PROVIDER_SITE_OTHER): Admitting: Advanced Practice Midwife

## 2023-10-29 VITALS — BP 114/74 | HR 76 | Wt 192.0 lb

## 2023-10-29 DIAGNOSIS — Z363 Encounter for antenatal screening for malformations: Secondary | ICD-10-CM | POA: Diagnosis not present

## 2023-10-29 DIAGNOSIS — Z3A12 12 weeks gestation of pregnancy: Secondary | ICD-10-CM | POA: Insufficient documentation

## 2023-10-29 DIAGNOSIS — Z113 Encounter for screening for infections with a predominantly sexual mode of transmission: Secondary | ICD-10-CM | POA: Diagnosis present

## 2023-10-29 DIAGNOSIS — Z3401 Encounter for supervision of normal first pregnancy, first trimester: Secondary | ICD-10-CM | POA: Diagnosis not present

## 2023-10-29 DIAGNOSIS — Z131 Encounter for screening for diabetes mellitus: Secondary | ICD-10-CM | POA: Diagnosis not present

## 2023-10-29 DIAGNOSIS — Z31430 Encounter of female for testing for genetic disease carrier status for procreative management: Secondary | ICD-10-CM

## 2023-10-29 NOTE — Progress Notes (Signed)
   LOW-RISK PREGNANCY VISIT Patient name: Jennifer Shaw MRN [id number]  Date of birth: [birth year] Chief Complaint:   No chief complaint on file.  History of Present Illness:   Jennifer Shaw is a 23 y.o. G1P0 female at [redacted]w[redacted]d with an Estimated Date of Delivery: 05/08/24 being seen today for ongoing management of a low-risk pregnancy.  Today she reports no complaints. Contractions: Irritability. Vag. Bleeding: None.   . denies leaking of fluid. Review of Systems:   Pertinent items are noted in HPI Denies abnormal vaginal discharge w/ itching/odor/irritation, headaches, visual changes, shortness of breath, chest pain, abdominal pain, severe nausea/vomiting, or problems with urination or bowel movements unless otherwise stated above. Pertinent History Reviewed:  Reviewed past medical,surgical, social, obstetrical and family history.  Reviewed problem list, medications and allergies. Physical Assessment:   Vitals:   10/29/23 0920  BP: 114/74  Pulse: 76  Weight: 192 lb (87.1 kg)  Body mass index is 30.07 kg/m.        Physical Examination:   General appearance: Well appearing, and in no distress  Mental status: Alert, oriented to person, place, and time  Skin: Warm & dry  Cardiovascular: Normal heart rate noted  Respiratory: Normal respiratory effort, no distress  Abdomen: Soft, gravid, nontender  Pelvic: thin prep Pap collected; cx appears closed         Extremities:    Fetal Status: Fetal Heart Rate (bpm): 159        No results found for this or any previous visit (from the past 24 hours).  Assessment & Plan:  1) Low-risk pregnancy G1P0 at [redacted]w[redacted]d with an Estimated Date of Delivery: 05/08/24   2) Dep/anx/panic d/o, doing much better since starting Prozac   3) Hypothyroid, restarted Synthroid  50mcg only 3-4wks ago; will get TSH w AFP at next visit   Meds: No orders of the defined types were placed in this encounter.  Labs/procedures today: PN1  Plan:  Continue routine obstetrical  care   Reviewed: Preterm labor symptoms and general obstetric precautions including but not limited to vaginal bleeding, contractions, leaking of fluid and fetal movement were reviewed in detail with the patient.  All questions were answered. Didn't ask about home bp cuff. Check bp weekly, let us  know if >140/90.   Follow-up: Return for 4wk LROB and AFP; 7-8wk LROB and anatomy u/s.  Orders Placed This Encounter  Procedures   US  OB Comp + 14 Wk   CBC/D/Plt+RPR+Rh+ABO+RubIgG...   HORIZON CUSTOM   PANORAMA PRENATAL TEST   Hemoglobin A1c   Jennifer Shaw [facility] 10/29/2023 10:06 AM

## 2023-10-30 LAB — CBC/D/PLT+RPR+RH+ABO+RUBIGG...
Antibody Screen: NEGATIVE
Basophils Absolute: 0 x10E3/uL (ref 0.0–0.2)
Basos: 0 %
EOS (ABSOLUTE): 0 x10E3/uL (ref 0.0–0.4)
Eos: 1 %
HCV Ab: NONREACTIVE
HIV Screen 4th Generation wRfx: NONREACTIVE
Hematocrit: 39.5 % (ref 34.0–46.6)
Hemoglobin: 13.2 g/dL (ref 11.1–15.9)
Hepatitis B Surface Ag: NEGATIVE
Immature Grans (Abs): 0 x10E3/uL (ref 0.0–0.1)
Immature Granulocytes: 0 %
Lymphocytes Absolute: 1.3 x10E3/uL (ref 0.7–3.1)
Lymphs: 18 %
MCH: 32.1 pg (ref 26.6–33.0)
MCHC: 33.4 g/dL (ref 31.5–35.7)
MCV: 96 fL (ref 79–97)
Monocytes Absolute: 0.5 x10E3/uL (ref 0.1–0.9)
Monocytes: 7 %
Neutrophils Absolute: 5.3 x10E3/uL (ref 1.4–7.0)
Neutrophils: 74 %
Platelets: 216 x10E3/uL (ref 150–450)
RBC: 4.11 x10E6/uL (ref 3.77–5.28)
RDW: 12.3 % (ref 11.7–15.4)
RPR Ser Ql: NONREACTIVE
Rh Factor: POSITIVE
Rubella Antibodies, IGG: 1.3 {index} (ref >0.99)
WBC: 7.2 x10E3/uL (ref 3.4–10.8)

## 2023-10-30 LAB — CYTOLOGY - PAP
Chlamydia: NEGATIVE
Comment: NEGATIVE
Comment: NORMAL
Diagnosis: NEGATIVE
Neisseria Gonorrhea: NEGATIVE

## 2023-10-30 LAB — HEMOGLOBIN A1C
Est. average glucose Bld gHb Est-mCnc: 100 mg/dL
Hgb A1c MFr Bld: 5.1 % (ref 4.8–5.6)

## 2023-10-30 LAB — HCV INTERPRETATION

## 2023-11-01 ENCOUNTER — Ambulatory Visit: Payer: Self-pay | Admitting: Advanced Practice Midwife

## 2023-11-07 LAB — PANORAMA PRENATAL TEST FULL PANEL:PANORAMA TEST PLUS 5 ADDITIONAL MICRODELETIONS: FETAL FRACTION: 5.4

## 2023-11-07 LAB — HORIZON CUSTOM: REPORT SUMMARY: NEGATIVE

## 2023-11-26 ENCOUNTER — Ambulatory Visit (INDEPENDENT_AMBULATORY_CARE_PROVIDER_SITE_OTHER): Admitting: Advanced Practice Midwife

## 2023-11-26 ENCOUNTER — Encounter: Payer: Self-pay | Admitting: Advanced Practice Midwife

## 2023-11-26 VITALS — BP 108/74 | HR 99 | Wt 198.0 lb

## 2023-11-26 DIAGNOSIS — Z3402 Encounter for supervision of normal first pregnancy, second trimester: Secondary | ICD-10-CM | POA: Diagnosis not present

## 2023-11-26 DIAGNOSIS — E039 Hypothyroidism, unspecified: Secondary | ICD-10-CM

## 2023-11-26 DIAGNOSIS — Z3401 Encounter for supervision of normal first pregnancy, first trimester: Secondary | ICD-10-CM

## 2023-11-26 DIAGNOSIS — Z3A16 16 weeks gestation of pregnancy: Secondary | ICD-10-CM

## 2023-11-26 DIAGNOSIS — Z1379 Encounter for other screening for genetic and chromosomal anomalies: Secondary | ICD-10-CM

## 2023-11-26 NOTE — Progress Notes (Signed)
   LOW-RISK PREGNANCY VISIT Patient name: Jennifer Shaw MRN [id number]  Date of birth: 27-Dec-2000 Chief Complaint:   Routine Prenatal Visit (AFP&TSH)  History of Present Illness:   Jennifer Shaw is a 23 y.o. G1P0 female at [redacted]w[redacted]d with an Estimated Date of Delivery: 05/08/24 being seen today for ongoing management of a low-risk pregnancy.  Today she reports round lig pain, esp when coughing/sneezing; feeling drained of energy today. Contractions: Not present.  .  Movement: Absent. denies leaking of fluid. Review of Systems:   Pertinent items are noted in HPI Denies abnormal vaginal discharge w/ itching/odor/irritation, headaches, visual changes, shortness of breath, chest pain, abdominal pain, severe nausea/vomiting, or problems with urination or bowel movements unless otherwise stated above. Pertinent History Reviewed:  Reviewed past medical,surgical, social, obstetrical and family history.  Reviewed problem list, medications and allergies. Physical Assessment:   Vitals:   11/26/23 0853  BP: 108/74  Pulse: 99  Weight: 198 lb (89.8 kg)  Body mass index is 31.01 kg/m.        Physical Examination:   General appearance: Well appearing, and in no distress  Mental status: Alert, oriented to person, place, and time  Skin: Warm & dry  Cardiovascular: Normal heart rate noted  Respiratory: Normal respiratory effort, no distress  Abdomen: Soft, gravid, nontender  Pelvic: Cervical exam deferred         Extremities: Edema: None  Fetal Status: Fetal Heart Rate (bpm): 155   Movement: Absent    No results found for this or any previous visit (from the past 24 hours).  Assessment & Plan:  1) Low-risk pregnancy G1P0 at [redacted]w[redacted]d with an Estimated Date of Delivery: 05/08/24   2) RLP, tips given to support abd when anticipates cough/sneeze/movement  3) Hypothyroid, doing well on Synthroid  ; TSH today   Meds: No orders of the defined types were placed in this encounter.  Labs/procedures today:  AFP & TSH (will get them drawn within the week- feeling tired today)  Plan:  Continue routine obstetrical care   Reviewed: Preterm labor symptoms and general obstetric precautions including but not limited to vaginal bleeding, contractions, leaking of fluid and fetal movement were reviewed in detail with the patient.  All questions were answered. Didn't ask about home bp cuff. Check bp weekly, let us  know if >140/90).  Next visit: as scheduled (anatomy u/s)  Orders Placed This Encounter  Procedures   AFP, Serum, Open Spina Bifida   TSH   Suzen JONETTA [facility] 11/26/2023 9:24 AM

## 2023-11-26 NOTE — Patient Instructions (Signed)
 Jennifer Shaw, thank you for choosing our office today! We appreciate the opportunity to meet your healthcare needs. You may receive a short survey by mail, e-mail, or through [employer]. If you are happy with your care we would appreciate if you could take just a few minutes to complete the survey questions. We read all of your comments and take your feedback very seriously. Thank you again for choosing our office.  Center for [employer] Team at [facility] at [facility] ([address] [place], [place] [address]) Entrance C, located off of [address] Free 24/7 valet parking  Go to [email or web address] to register for FREE online childbirth classes  Call the office [phone]) or go to [facility] if: You begin to severe cramping Your water breaks.  Sometimes it is a big gush of fluid, sometimes it is just a trickle that keeps getting your panties wet or running down your legs You have vaginal bleeding.  It is normal to have a small amount of spotting if your cervix was checked.   [facility] Pediatricians/Family Doctors [facility]): [address] Luba BROCKS, [phone]           [facility]: [address], [phone]                [facility]): [address], [phone] (call to ask if accepting patients) [facility] Department: [address], [place], [phone]    [facility] Pediatricians/Family Doctors [facility]): [phone], [phone] [facility]: [address] [place], [phone] [facility]: [address], [phone]  [facility] Doctors  [facility]): [phone] [facility]: [address] [phone]   [facility] Doctors [facility]: [address] [phone]  [facility] Doctors  [facility]: [phone], [phone]  Home Blood Pressure Monitoring for Patients   Your provider has recommended that you check your blood pressure (BP) at least once a week at home. If you do not have a blood pressure cuff at home, one will be provided for you. Contact your provider if you have not received your monitor within 1 week.   Helpful Tips for Accurate Home Blood Pressure Checks  Don't smoke, exercise, or drink caffeine 30 minutes before checking your BP Use the restroom before checking your BP (a full bladder can raise your pressure) Relax in a comfortable upright chair Feet on the ground Left arm resting comfortably on a flat surface at the level of your heart Legs uncrossed Back supported Sit quietly and don't talk Place the cuff on your bare arm Adjust snuggly, so that only two fingertips can fit between your skin and the top of the cuff Check 2 readings separated by at least one minute Keep a log of your BP readings For a visual, please reference this diagram: http://ccnc.care/bpdiagram  Provider Name: [facility]     Phone: [phone]  Zone 1: ALL CLEAR  Continue to monitor your symptoms:  BP reading is less than 140 (top number) or less than 90 (bottom number)  No right upper stomach pain No headaches or seeing spots No feeling nauseated or throwing up No swelling in face and hands  Zone 2: CAUTION Call your doctor's office for any of the following:  BP reading is greater than 140 (top number) or greater than  90 (bottom number)  Stomach pain under your ribs in the middle or right side Headaches or seeing spots Feeling nauseated or throwing up Swelling in face and hands  Zone 3: EMERGENCY  Seek immediate medical care if you have any of the following:  BP reading is greater than160 (top number) or greater than 110 (bottom number) Severe headaches not improving with Tylenol Serious difficulty catching your breath Any worsening symptoms from Zone 2      Second Trimester of Pregnancy The second trimester is from week 14 through week 27 (months 4 through 6). The second trimester is often a time when you feel your best. Your body has adjusted to being pregnant, and you begin to feel better physically. Usually, morning sickness has lessened or quit completely, you may have more energy, and you may have an increase in appetite. The second trimester is also a time when the fetus is growing rapidly. At the end of the sixth month, the fetus is about 9 inches long and weighs about 1 pounds. You will likely begin to feel the baby move (quickening) between 16 and 20 weeks of pregnancy. Body changes during your second trimester Your body continues to go through many changes during your second trimester. The changes vary from woman to woman. Your weight will continue to increase. You will notice your lower abdomen bulging out. You may begin to get stretch marks on your hips, abdomen, and breasts. You may develop headaches that can be relieved by medicines. The medicines should be approved by your health care provider. You may urinate more often because the fetus is pressing on your bladder. You may develop or continue to have heartburn as a result of your pregnancy. You may develop constipation because certain hormones are causing the muscles that push waste through your intestines to slow down. You may develop hemorrhoids or swollen, bulging veins (varicose veins). You may have back pain. This is caused by: Weight gain. Pregnancy hormones that are relaxing the joints in your pelvis. A shift in weight and the muscles that support your balance. Your breasts will continue to grow and they will continue to become tender. Your gums may bleed and may be sensitive to brushing and flossing. Dark spots or blotches (chloasma, mask of pregnancy) may develop on your face. This will likely fade after the baby is born. A dark line from your belly button to the  pubic area (linea nigra) may appear. This will likely fade after the baby is born. You may have changes in your hair. These can include thickening of your hair, rapid growth, and changes in texture. Some women also have hair loss during or after pregnancy, or hair that feels dry or thin. Your hair will most likely return to normal after your baby is born.  What to expect at prenatal visits During a routine prenatal visit: You will be weighed to make sure you and the fetus are growing normally. Your blood pressure will be taken. Your abdomen will be measured to track your baby's growth. The fetal heartbeat will be listened to. Any test results from the previous visit will be discussed.  Your health care provider may ask you: How you are feeling. If you are feeling the baby move. If you have had any abnormal symptoms, such as leaking fluid, bleeding, severe headaches, or abdominal cramping. If you are using any tobacco products, including cigarettes, chewing tobacco, and electronic cigarettes. If you have any questions.  Other tests that may be performed during  your second trimester include: Blood tests that check for: Low iron levels (anemia). High blood sugar that affects pregnant women (gestational diabetes) between 69 and 28 weeks. Rh antibodies. This is to check for a protein on red blood cells (Rh factor). Urine tests to check for infections, diabetes, or protein in the urine. An ultrasound to confirm the proper growth and development of the baby. An amniocentesis to check for possible genetic problems. Fetal screens for spina bifida and Down syndrome. HIV (human immunodeficiency virus) testing. Routine prenatal testing includes screening for HIV, unless you choose not to have this test.  Follow these instructions at home: Medicines Follow your health care provider's instructions regarding medicine use. Specific medicines may be either safe or unsafe to take during pregnancy. Take  a prenatal vitamin that contains at least 600 micrograms (mcg) of folic acid . If you develop constipation, try taking a stool softener if your health care provider approves. Eating and drinking Eat a balanced diet that includes fresh fruits and vegetables, whole grains, good sources of protein such as meat, eggs, or tofu, and low-fat dairy. Your health care provider will help you determine the amount of weight gain that is right for you. Avoid raw meat and uncooked cheese. These carry germs that can cause birth defects in the baby. If you have low calcium intake from food, talk to your health care provider about whether you should take a daily calcium supplement. Limit foods that are high in fat and processed sugars, such as fried and sweet foods. To prevent constipation: Drink enough fluid to keep your urine clear or pale yellow. Eat foods that are high in fiber, such as fresh fruits and vegetables, whole grains, and beans. Activity Exercise only as directed by your health care provider. Most women can continue their usual exercise routine during pregnancy. Try to exercise for 30 minutes at least 5 days a week. Stop exercising if you experience uterine contractions. Avoid heavy lifting, wear low heel shoes, and practice good posture. A sexual relationship may be continued unless your health care provider directs you otherwise. Relieving pain and discomfort Wear a good support bra to prevent discomfort from breast tenderness. Take warm sitz baths to soothe any pain or discomfort caused by hemorrhoids. Use hemorrhoid cream if your health care provider approves. Rest with your legs elevated if you have leg cramps or low back pain. If you develop varicose veins, wear support hose. Elevate your feet for 15 minutes, 3-4 times a day. Limit salt in your diet. Prenatal Care Write down your questions. Take them to your prenatal visits. Keep all your prenatal visits as told by your health care provider.  This is important. Safety Wear your seat belt at all times when driving. Make a list of emergency phone numbers, including numbers for family, friends, the hospital, and police and fire departments. General instructions Ask your health care provider for a referral to a local prenatal education class. Begin classes no later than the beginning of month 6 of your pregnancy. Ask for help if you have counseling or nutritional needs during pregnancy. Your health care provider can offer advice or refer you to specialists for help with various needs. Do not use hot tubs, steam rooms, or saunas. Do not douche or use tampons or scented sanitary pads. Do not cross your legs for long periods of time. Avoid cat litter boxes and soil used by cats. These carry germs that can cause birth defects in the baby and possibly loss of the  fetus by miscarriage or stillbirth. Avoid all smoking, herbs, alcohol, and unprescribed drugs. Chemicals in these products can affect the formation and growth of the baby. Do not use any products that contain nicotine or tobacco, such as cigarettes and e-cigarettes. If you need help quitting, ask your health care provider. Visit your dentist if you have not gone yet during your pregnancy. Use a soft toothbrush to brush your teeth and be gentle when you floss. Contact a health care provider if: You have dizziness. You have mild pelvic cramps, pelvic pressure, or nagging pain in the abdominal area. You have persistent nausea, vomiting, or diarrhea. You have a bad smelling vaginal discharge. You have pain when you urinate. Get help right away if: You have a fever. You are leaking fluid from your vagina. You have spotting or bleeding from your vagina. You have severe abdominal cramping or pain. You have rapid weight gain or weight loss. You have shortness of breath with chest pain. You notice sudden or extreme swelling of your face, hands, ankles, feet, or legs. You have not felt  your baby move in over an hour. You have severe headaches that do not go away when you take medicine. You have vision changes. Summary The second trimester is from week 14 through week 27 (months 4 through 6). It is also a time when the fetus is growing rapidly. Your body goes through many changes during pregnancy. The changes vary from woman to woman. Avoid all smoking, herbs, alcohol, and unprescribed drugs. These chemicals affect the formation and growth your baby. Do not use any tobacco products, such as cigarettes, chewing tobacco, and e-cigarettes. If you need help quitting, ask your health care provider. Contact your health care provider if you have any questions. Keep all prenatal visits as told by your health care provider. This is important. This information is not intended to replace advice given to you by your health care provider. Make sure you discuss any questions you have with your health care provider. Document Released: 01/22/2001 Document Revised: 07/06/2015 Document Reviewed: 03/31/2012 Elsevier Interactive Patient Education  2017 [employer].

## 2023-11-26 NOTE — Progress Notes (Signed)
9

## 2023-12-24 ENCOUNTER — Ambulatory Visit (INDEPENDENT_AMBULATORY_CARE_PROVIDER_SITE_OTHER)

## 2023-12-24 ENCOUNTER — Encounter: Payer: Self-pay | Admitting: Women's Health

## 2023-12-24 ENCOUNTER — Ambulatory Visit (INDEPENDENT_AMBULATORY_CARE_PROVIDER_SITE_OTHER): Admitting: Women's Health

## 2023-12-24 ENCOUNTER — Other Ambulatory Visit (HOSPITAL_COMMUNITY)
Admission: RE | Admit: 2023-12-24 | Discharge: 2023-12-24 | Disposition: A | Discharge status: Home / Self Care | Source: Ambulatory Visit | Attending: Women's Health | Admitting: Women's Health

## 2023-12-24 VITALS — BP 102/71 | HR 99 | Wt 204.0 lb

## 2023-12-24 DIAGNOSIS — O26892 Other specified pregnancy related conditions, second trimester: Secondary | ICD-10-CM | POA: Diagnosis not present

## 2023-12-24 DIAGNOSIS — Z363 Encounter for antenatal screening for malformations: Secondary | ICD-10-CM | POA: Diagnosis not present

## 2023-12-24 DIAGNOSIS — E039 Hypothyroidism, unspecified: Secondary | ICD-10-CM | POA: Diagnosis not present

## 2023-12-24 DIAGNOSIS — F418 Other specified anxiety disorders: Secondary | ICD-10-CM | POA: Insufficient documentation

## 2023-12-24 DIAGNOSIS — N898 Other specified noninflammatory disorders of vagina: Secondary | ICD-10-CM | POA: Diagnosis not present

## 2023-12-24 DIAGNOSIS — Z3A20 20 weeks gestation of pregnancy: Secondary | ICD-10-CM | POA: Diagnosis not present

## 2023-12-24 DIAGNOSIS — R8290 Unspecified abnormal findings in urine: Secondary | ICD-10-CM | POA: Diagnosis not present

## 2023-12-24 DIAGNOSIS — Z3402 Encounter for supervision of normal first pregnancy, second trimester: Secondary | ICD-10-CM

## 2023-12-24 DIAGNOSIS — Z1379 Encounter for other screening for genetic and chromosomal anomalies: Secondary | ICD-10-CM | POA: Diagnosis not present

## 2023-12-24 NOTE — Patient Instructions (Signed)
 Jennifer Shaw, thank you for choosing our office today! We appreciate the opportunity to meet your healthcare needs. You may receive a short survey by mail, e-mail, or through [employer]. If you are happy with your care we would appreciate if you could take just a few minutes to complete the survey questions. We read all of your comments and take your feedback very seriously. Thank you again for choosing our office.  Center for [employer] Team at [facility] at [facility] ([address] [place], [place] [address]) Entrance C, located off of [address] Free 24/7 valet parking  Go to [email or web address] to register for FREE online childbirth classes  Call the office [phone]) or go to [facility] if: You begin to severe cramping Your water breaks.  Sometimes it is a big gush of fluid, sometimes it is just a trickle that keeps getting your panties wet or running down your legs You have vaginal bleeding.  It is normal to have a small amount of spotting if your cervix was checked.   [facility] Pediatricians/Family Doctors [facility]): [address] Luba BROCKS, [phone]           [facility]: [address], [phone]                [facility]): [address], [phone] (call to ask if accepting patients) [facility] Department: [address], [place], [phone]    [facility] Pediatricians/Family Doctors [facility]): [phone], [phone] [facility]: [address] [place], [phone] [facility]: [address], [phone]  [facility] Doctors  [facility]): [phone] [facility]: [address] [phone]   [facility] Doctors [facility]: [address] [phone]  [facility] Doctors  [facility]: [phone], [phone]  Home Blood Pressure Monitoring for Patients   Your provider has recommended that you check your blood pressure (BP) at least once a week at home. If you do not have a blood pressure cuff at home, one will be provided for you. Contact your provider if you have not received your monitor within 1 week.   Helpful Tips for Accurate Home Blood Pressure Checks  Don't smoke, exercise, or drink caffeine 30 minutes before checking your BP Use the restroom before checking your BP (a full bladder can raise your pressure) Relax in a comfortable upright chair Feet on the ground Left arm resting comfortably on a flat surface at the level of your heart Legs uncrossed Back supported Sit quietly and don't talk Place the cuff on your bare arm Adjust snuggly, so that only two fingertips can fit between your skin and the top of the cuff Check 2 readings separated by at least one minute Keep a log of your BP readings For a visual, please reference this diagram: http://ccnc.care/bpdiagram  Provider Name: [facility]     Phone: [phone]  Zone 1: ALL CLEAR  Continue to monitor your symptoms:  BP reading is less than 140 (top number) or less than 90 (bottom number)  No right upper stomach pain No headaches or seeing spots No feeling nauseated or throwing up No swelling in face and hands  Zone 2: CAUTION Call your doctor's office for any of the following:  BP reading is greater than 140 (top number) or greater than  90 (bottom number)  Stomach pain under your ribs in the middle or right side Headaches or seeing spots Feeling nauseated or throwing up Swelling in face and hands  Zone 3: EMERGENCY  Seek immediate medical care if you have any of the following:  BP reading is greater than160 (top number) or greater than 110 (bottom number) Severe headaches not improving with Tylenol Serious difficulty catching your breath Any worsening symptoms from Zone 2      Second Trimester of Pregnancy The second trimester is from week 14 through week 27 (months 4 through 6). The second trimester is often a time when you feel your best. Your body has adjusted to being pregnant, and you begin to feel better physically. Usually, morning sickness has lessened or quit completely, you may have more energy, and you may have an increase in appetite. The second trimester is also a time when the fetus is growing rapidly. At the end of the sixth month, the fetus is about 9 inches long and weighs about 1 pounds. You will likely begin to feel the baby move (quickening) between 16 and 20 weeks of pregnancy. Body changes during your second trimester Your body continues to go through many changes during your second trimester. The changes vary from woman to woman. Your weight will continue to increase. You will notice your lower abdomen bulging out. You may begin to get stretch marks on your hips, abdomen, and breasts. You may develop headaches that can be relieved by medicines. The medicines should be approved by your health care provider. You may urinate more often because the fetus is pressing on your bladder. You may develop or continue to have heartburn as a result of your pregnancy. You may develop constipation because certain hormones are causing the muscles that push waste through your intestines to slow down. You may develop hemorrhoids or swollen, bulging veins (varicose veins). You may have back pain. This is caused by: Weight gain. Pregnancy hormones that are relaxing the joints in your pelvis. A shift in weight and the muscles that support your balance. Your breasts will continue to grow and they will continue to become tender. Your gums may bleed and may be sensitive to brushing and flossing. Dark spots or blotches (chloasma, mask of pregnancy) may develop on your face. This will likely fade after the baby is born. A dark line from your belly button to the  pubic area (linea nigra) may appear. This will likely fade after the baby is born. You may have changes in your hair. These can include thickening of your hair, rapid growth, and changes in texture. Some women also have hair loss during or after pregnancy, or hair that feels dry or thin. Your hair will most likely return to normal after your baby is born.  What to expect at prenatal visits During a routine prenatal visit: You will be weighed to make sure you and the fetus are growing normally. Your blood pressure will be taken. Your abdomen will be measured to track your baby's growth. The fetal heartbeat will be listened to. Any test results from the previous visit will be discussed.  Your health care provider may ask you: How you are feeling. If you are feeling the baby move. If you have had any abnormal symptoms, such as leaking fluid, bleeding, severe headaches, or abdominal cramping. If you are using any tobacco products, including cigarettes, chewing tobacco, and electronic cigarettes. If you have any questions.  Other tests that may be performed during  your second trimester include: Blood tests that check for: Low iron levels (anemia). High blood sugar that affects pregnant women (gestational diabetes) between 69 and 28 weeks. Rh antibodies. This is to check for a protein on red blood cells (Rh factor). Urine tests to check for infections, diabetes, or protein in the urine. An ultrasound to confirm the proper growth and development of the baby. An amniocentesis to check for possible genetic problems. Fetal screens for spina bifida and Down syndrome. HIV (human immunodeficiency virus) testing. Routine prenatal testing includes screening for HIV, unless you choose not to have this test.  Follow these instructions at home: Medicines Follow your health care provider's instructions regarding medicine use. Specific medicines may be either safe or unsafe to take during pregnancy. Take  a prenatal vitamin that contains at least 600 micrograms (mcg) of folic acid . If you develop constipation, try taking a stool softener if your health care provider approves. Eating and drinking Eat a balanced diet that includes fresh fruits and vegetables, whole grains, good sources of protein such as meat, eggs, or tofu, and low-fat dairy. Your health care provider will help you determine the amount of weight gain that is right for you. Avoid raw meat and uncooked cheese. These carry germs that can cause birth defects in the baby. If you have low calcium intake from food, talk to your health care provider about whether you should take a daily calcium supplement. Limit foods that are high in fat and processed sugars, such as fried and sweet foods. To prevent constipation: Drink enough fluid to keep your urine clear or pale yellow. Eat foods that are high in fiber, such as fresh fruits and vegetables, whole grains, and beans. Activity Exercise only as directed by your health care provider. Most women can continue their usual exercise routine during pregnancy. Try to exercise for 30 minutes at least 5 days a week. Stop exercising if you experience uterine contractions. Avoid heavy lifting, wear low heel shoes, and practice good posture. A sexual relationship may be continued unless your health care provider directs you otherwise. Relieving pain and discomfort Wear a good support bra to prevent discomfort from breast tenderness. Take warm sitz baths to soothe any pain or discomfort caused by hemorrhoids. Use hemorrhoid cream if your health care provider approves. Rest with your legs elevated if you have leg cramps or low back pain. If you develop varicose veins, wear support hose. Elevate your feet for 15 minutes, 3-4 times a day. Limit salt in your diet. Prenatal Care Write down your questions. Take them to your prenatal visits. Keep all your prenatal visits as told by your health care provider.  This is important. Safety Wear your seat belt at all times when driving. Make a list of emergency phone numbers, including numbers for family, friends, the hospital, and police and fire departments. General instructions Ask your health care provider for a referral to a local prenatal education class. Begin classes no later than the beginning of month 6 of your pregnancy. Ask for help if you have counseling or nutritional needs during pregnancy. Your health care provider can offer advice or refer you to specialists for help with various needs. Do not use hot tubs, steam rooms, or saunas. Do not douche or use tampons or scented sanitary pads. Do not cross your legs for long periods of time. Avoid cat litter boxes and soil used by cats. These carry germs that can cause birth defects in the baby and possibly loss of the  fetus by miscarriage or stillbirth. Avoid all smoking, herbs, alcohol, and unprescribed drugs. Chemicals in these products can affect the formation and growth of the baby. Do not use any products that contain nicotine or tobacco, such as cigarettes and e-cigarettes. If you need help quitting, ask your health care provider. Visit your dentist if you have not gone yet during your pregnancy. Use a soft toothbrush to brush your teeth and be gentle when you floss. Contact a health care provider if: You have dizziness. You have mild pelvic cramps, pelvic pressure, or nagging pain in the abdominal area. You have persistent nausea, vomiting, or diarrhea. You have a bad smelling vaginal discharge. You have pain when you urinate. Get help right away if: You have a fever. You are leaking fluid from your vagina. You have spotting or bleeding from your vagina. You have severe abdominal cramping or pain. You have rapid weight gain or weight loss. You have shortness of breath with chest pain. You notice sudden or extreme swelling of your face, hands, ankles, feet, or legs. You have not felt  your baby move in over an hour. You have severe headaches that do not go away when you take medicine. You have vision changes. Summary The second trimester is from week 14 through week 27 (months 4 through 6). It is also a time when the fetus is growing rapidly. Your body goes through many changes during pregnancy. The changes vary from woman to woman. Avoid all smoking, herbs, alcohol, and unprescribed drugs. These chemicals affect the formation and growth your baby. Do not use any tobacco products, such as cigarettes, chewing tobacco, and e-cigarettes. If you need help quitting, ask your health care provider. Contact your health care provider if you have any questions. Keep all prenatal visits as told by your health care provider. This is important. This information is not intended to replace advice given to you by your health care provider. Make sure you discuss any questions you have with your health care provider. Document Released: 01/22/2001 Document Revised: 07/06/2015 Document Reviewed: 03/31/2012 Elsevier Interactive Patient Education  2017 [employer].

## 2023-12-24 NOTE — Progress Notes (Signed)
 LOW-RISK PREGNANCY VISIT Patient name: Jennifer Shaw MRN [id number]  Date of birth: 2000/03/06 Chief Complaint:   Routine Prenatal Visit and Pregnancy Ultrasound  History of Present Illness:   Jennifer Shaw is a 23 y.o. G1P0 female at 104w4d with an Estimated Date of Delivery: 05/08/24 being seen today for ongoing management of a low-risk pregnancy.   Today she reports bad odor to urine, no UTI sx. Yellow vaginal d/c w/ odor, no itching/irritation. Contractions: Not present.  .  Movement: Present. denies leaking of fluid.     10/01/2023    9:37 AM 08/29/2023   10:11 AM 08/01/2023    3:03 PM 07/04/2023    2:24 PM 06/19/2023    2:52 PM  Depression screen PHQ 2/9  Decreased Interest 3 1 1 1 1   Down, Depressed, Hopeless 0 0 0 0 2  PHQ - 2 Score 3 1 1 1 3   Altered sleeping 2 2 0 0 3  Tired, decreased energy 3 3 2 2 2   Change in appetite 0 0 0 2 3  Feeling bad or failure about yourself  0 0 0 0 0  Trouble concentrating 0 0 0 0 0  Moving slowly or fidgety/restless 0 0 0 0 0  Suicidal thoughts 0 0 0 0 0  PHQ-9 Score 8  6  3  5  11    Difficult doing work/chores  Not difficult at all Not difficult at all Somewhat difficult Somewhat difficult     Data saved with a previous flowsheet row definition        10/01/2023    9:37 AM 08/29/2023   10:12 AM 08/01/2023    3:03 PM 07/04/2023    2:25 PM  GAD 7 : Generalized Anxiety Score  Nervous, Anxious, on Edge 0 0 1 2  Control/stop worrying 0 0 1 0  Worry too much - different things 0 0 0 0  Trouble relaxing 3 0 2 0  Restless 0 0 0 0  Easily annoyed or irritable 3 0 3 0  Afraid - awful might happen 0 0 0 0  Total GAD 7 Score 6 0 7 2  Anxiety Difficulty  Not difficult at all Not difficult at all Not difficult at all      Review of Systems:   Pertinent items are noted in HPI Denies abnormal vaginal discharge w/ itching/odor/irritation, headaches, visual changes, shortness of breath, chest pain, abdominal pain, severe nausea/vomiting, or  problems with urination or bowel movements unless otherwise stated above. Pertinent History Reviewed:  Reviewed past medical,surgical, social, obstetrical and family history.  Reviewed problem list, medications and allergies. Physical Assessment:   Vitals:   12/24/23 0946  BP: 102/71  Pulse: 99  Weight: 204 lb (92.5 kg)  Body mass index is 31.95 kg/m.        Physical Examination:   General appearance: Well appearing, and in no distress  Mental status: Alert, oriented to person, place, and time  Skin: Warm & dry  Cardiovascular: Normal heart rate noted  Respiratory: Normal respiratory effort, no distress  Abdomen: Soft, gravid, nontender  Pelvic: Cervical exam deferred, self-collected CV swab        Extremities: Edema: None  Fetal Status:     Movement: Present  US  20+4 wks,cephalic,cx 2.9 cm,anterior placenta gr 0,normal ovaries,FHR 159 bpm,SVP of fluid 3.2 cm,EFW 349 g 33%,anatomy complete,no obvious abnormalities   Chaperone: N/A No results found for this or any previous visit (from the past 24 hours).  Assessment & Plan:  1) Low-risk pregnancy G1P0 at [redacted]w[redacted]d with an Estimated Date of Delivery: 05/08/24   2) Bad odor to urine, send urine cx  3) Vaginal d/c w/ odor> self collected CV swab  4) Hypothyroidism> on synthroid  , repeat TSH today   Meds: No orders of the defined types were placed in this encounter.  Labs/procedures today: CV swab, urine culture, AFP, and TSH  Plan:  Continue routine obstetrical care  Next visit: prefers in person    Reviewed: Preterm labor symptoms and general obstetric precautions including but not limited to vaginal bleeding, contractions, leaking of fluid and fetal movement were reviewed in detail with the patient.  All questions were answered. Does have home bp cuff. Office bp cuff given: not applicable. Check bp weekly, let us  know if consistently >140 and/or >90.  Follow-up: Return in about 4 weeks (around 01/21/2024) for LROB, CNM, in  person.  Future Appointments  Date Time Provider Department Center  01/21/2024 10:10 AM Kizzie Suzen SAUNDERS, CNM [facility]    Orders Placed This Encounter  Procedures   Urine Culture   AFP, Serum, Open Spina Bifida   Suzen SAUNDERS Kizzie CNM, [facility] 12/24/2023 3:18 PM

## 2023-12-24 NOTE — Progress Notes (Signed)
 US  20+4 wks,cephalic,cx 2.9 cm,anterior placenta gr 0,normal ovaries,FHR 159 bpm,SVP of fluid 3.2 cm,EFW 349 g 33%,anatomy complete,no obvious abnormalities

## 2023-12-25 ENCOUNTER — Ambulatory Visit: Payer: Self-pay | Admitting: Women's Health

## 2023-12-25 ENCOUNTER — Other Ambulatory Visit: Payer: Self-pay | Admitting: Advanced Practice Midwife

## 2023-12-25 ENCOUNTER — Ambulatory Visit: Payer: Self-pay | Admitting: Advanced Practice Midwife

## 2023-12-25 LAB — CERVICOVAGINAL ANCILLARY ONLY
Bacterial Vaginitis (gardnerella): NEGATIVE
Candida Glabrata: NEGATIVE
Candida Vaginitis: NEGATIVE
Chlamydia: NEGATIVE
Comment: NEGATIVE
Comment: NEGATIVE
Comment: NEGATIVE
Comment: NEGATIVE
Comment: NEGATIVE
Comment: NORMAL
Neisseria Gonorrhea: NEGATIVE
Trichomonas: NEGATIVE

## 2023-12-25 LAB — TSH: TSH: 5.2 u[IU]/mL — ABNORMAL HIGH (ref 0.450–4.500)

## 2023-12-25 MED ORDER — LEVOTHYROXINE SODIUM 75 MCG PO TABS: 75.0000 ug | ORAL_TABLET | Freq: Every day | ORAL | 3 refills | Status: DC

## 2023-12-26 LAB — AFP, SERUM, OPEN SPINA BIFIDA
AFP MoM: 1.51
AFP Value: 75.6 ng/mL
Gest. Age on Collection Date: 20.6 wk
Maternal Age At EDD: 22.2 a
OSBR Risk 1 IN: 2661
Test Results:: NEGATIVE
Weight: 204 [lb_av]

## 2023-12-26 LAB — URINE CULTURE

## 2023-12-30 IMAGING — DX DG FINGER LITTLE 2+V*L*
3 series · 3 of 3 positions shown · non-contrast
Comparison: None.

CLINICAL DATA: Finger injury.

EXAM:
LEFT LITTLE FINGER 2+V

[finger ap]
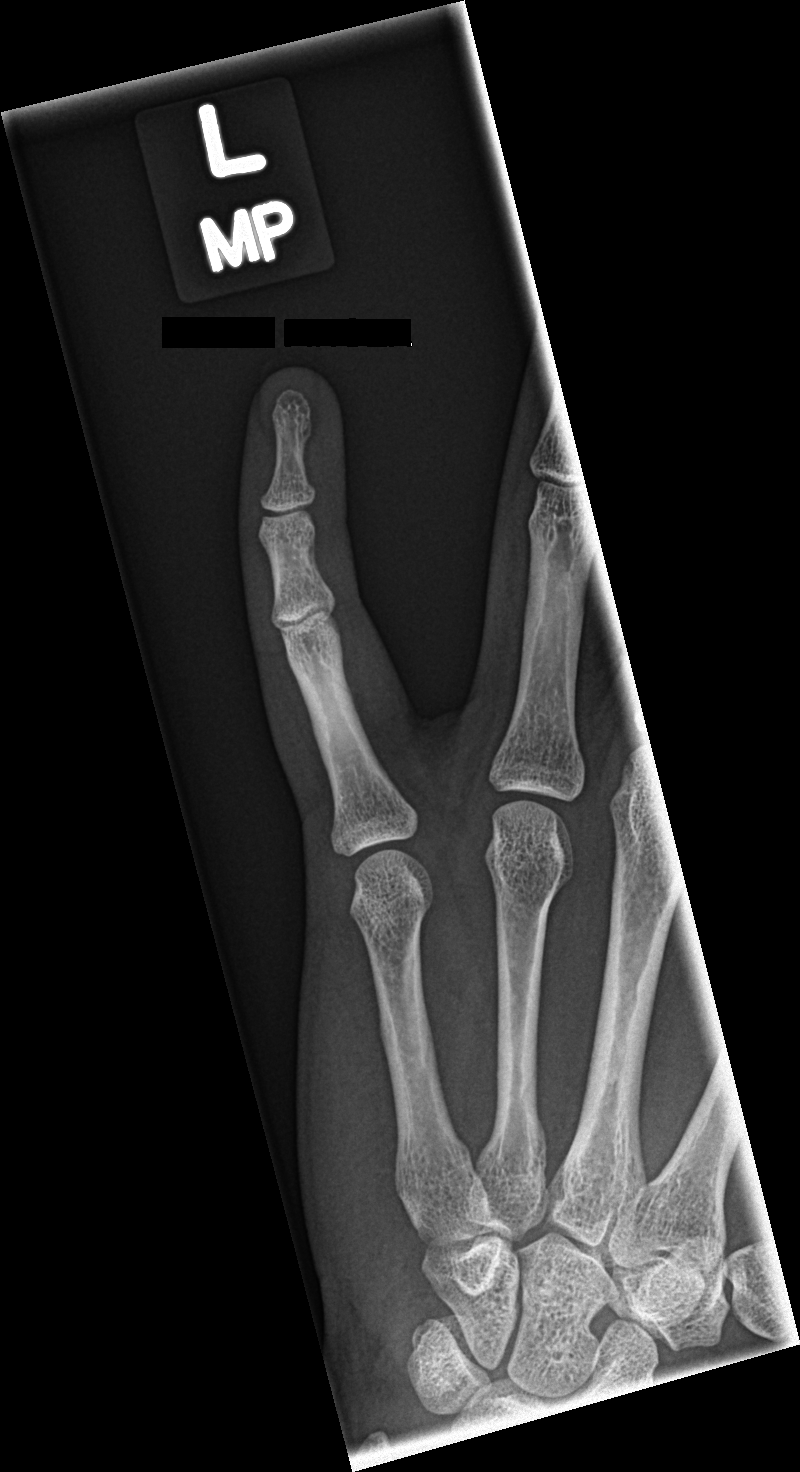

[finger obl]
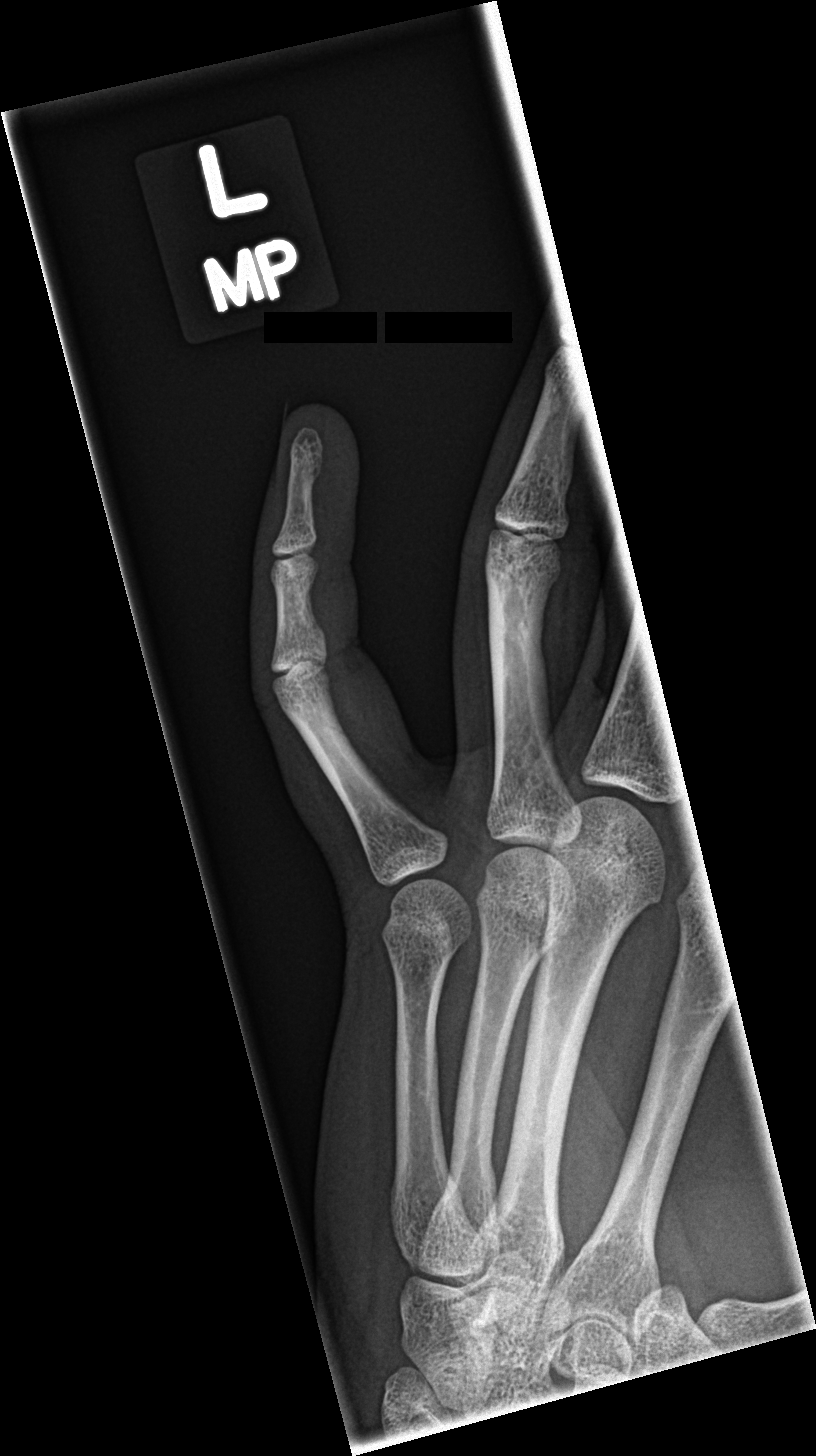

[finger lat]
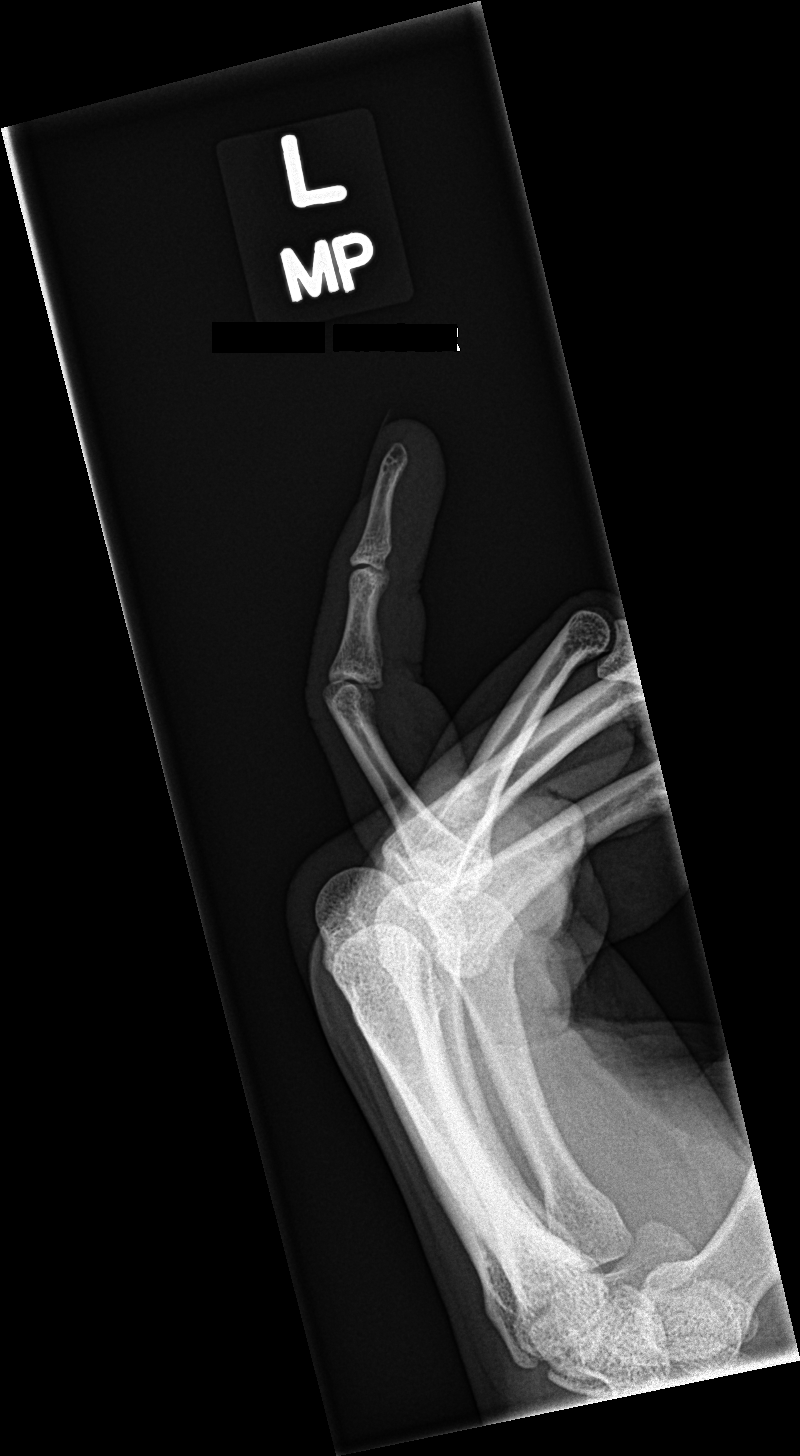

[3 of 3 positions shown; findings below may reference images not displayed]

FINDINGS: There is no evidence of fracture or dislocation. There is no
evidence of arthropathy or other focal bone abnormality. Soft
tissues are unremarkable.
IMPRESSION: Negative.

## 2024-01-21 ENCOUNTER — Encounter: Payer: Self-pay | Admitting: Obstetrics & Gynecology

## 2024-01-21 ENCOUNTER — Ambulatory Visit: Admitting: Obstetrics & Gynecology

## 2024-01-21 VITALS — BP 117/79 | HR 89 | Wt 210.4 lb

## 2024-01-21 DIAGNOSIS — O99282 Endocrine, nutritional and metabolic diseases complicating pregnancy, second trimester: Secondary | ICD-10-CM | POA: Diagnosis not present

## 2024-01-21 DIAGNOSIS — E039 Hypothyroidism, unspecified: Secondary | ICD-10-CM | POA: Diagnosis not present

## 2024-01-21 DIAGNOSIS — Z3A24 24 weeks gestation of pregnancy: Secondary | ICD-10-CM

## 2024-01-21 DIAGNOSIS — Z3402 Encounter for supervision of normal first pregnancy, second trimester: Secondary | ICD-10-CM

## 2024-01-21 DIAGNOSIS — F418 Other specified anxiety disorders: Secondary | ICD-10-CM | POA: Diagnosis not present

## 2024-01-21 DIAGNOSIS — O99342 Other mental disorders complicating pregnancy, second trimester: Secondary | ICD-10-CM | POA: Diagnosis not present

## 2024-01-21 NOTE — Progress Notes (Signed)
 HIGH-RISK PREGNANCY VISIT Patient name: Jennifer Shaw MRN [id number]  Date of birth: Jul 14, 2000 Chief Complaint:   Routine Prenatal Visit  History of Present Illness:   Jennifer Shaw is a 23 y.o. G1P0 female at [redacted]w[redacted]d with an Estimated Date of Delivery: 05/08/24 being seen today for ongoing management of a high-risk pregnancy complicated by:  - Hypothyroidism - Anxiety -doing well with Prozac   Today she reports no complaints.   Contractions: Not present. Vag. Bleeding: None.  Movement: Present. denies leaking of fluid.      10/01/2023    9:37 AM 08/29/2023   10:11 AM 08/01/2023    3:03 PM 07/04/2023    2:24 PM 06/19/2023    2:52 PM  Depression screen PHQ 2/9  Decreased Interest 3 1 1 1 1   Down, Depressed, Hopeless 0 0 0 0 2  PHQ - 2 Score 3 1 1 1 3   Altered sleeping 2 2 0 0 3  Tired, decreased energy 3 3 2 2 2   Change in appetite 0 0 0 2 3  Feeling bad or failure about yourself  0 0 0 0 0  Trouble concentrating 0 0 0 0 0  Moving slowly or fidgety/restless 0 0 0 0 0  Suicidal thoughts 0 0 0 0 0  PHQ-9 Score 8  6  3  5  11    Difficult doing work/chores  Not difficult at all Not difficult at all Somewhat difficult Somewhat difficult     Data saved with a previous flowsheet row definition     Current Outpatient Medications  Medication Instructions   Blood Pressure Monitor MISC For regular home bp monitoring during pregnancy   FLUoxetine  (PROZAC ) 40 mg, Oral, Daily   levothyroxine  (SYNTHROID ) 75 mcg, Oral, Daily before breakfast   Prenatal Vit-Fe Fumarate-FA (PRENATAL VITAMIN PO) Take by mouth.     Review of Systems:   Pertinent items are noted in HPI Denies abnormal vaginal discharge w/ itching/odor/irritation, headaches, visual changes, shortness of breath, chest pain, abdominal pain, severe nausea/vomiting, or problems with urination or bowel movements unless otherwise stated above. Pertinent History Reviewed:  Reviewed past medical,surgical, social, obstetrical and family  history.  Reviewed problem list, medications and allergies. Physical Assessment:   Vitals:   01/21/24 1021  BP: 117/79  Pulse: 89  Weight: 210 lb 6.4 oz (95.4 kg)  Body mass index is 32.95 kg/m.           Physical Examination:   General appearance: alert, well appearing, and in no distress  Mental status: normal mood, behavior, speech, dress, motor activity, and thought processes  Skin: warm & dry   Extremities: Edema: None    Cardiovascular: normal heart rate noted  Respiratory: normal respiratory effort, no distress  Abdomen: gravid, soft, non-tender  Pelvic: Cervical exam deferred         Fetal Status: Fetal Heart Rate (bpm): 140 Fundal Height: 24 cm Movement: Present    Fetal Surveillance Testing today: doppler   Chaperone: N/A    No results found for this or any previous visit (from the past 24 hours).   Assessment & Plan:  High-risk pregnancy: G1P0 at [redacted]w[redacted]d with an Estimated Date of Delivery: 05/08/24   1) - Hypothyroidism - On Synthroid  75 mcg, plan to check with PN2  2) Anxiety - No change to current meds  Meds: No orders of the defined types were placed in this encounter.   Labs/procedures today: flu vaccine  Treatment Plan:  PN2 next visit  Reviewed: Preterm labor  symptoms and general obstetric precautions including but not limited to vaginal bleeding, contractions, leaking of fluid and fetal movement were reviewed in detail with the patient.  All questions were answered. Pt has home bp cuff. Check bp weekly, let us  know if >140/90.   Follow-up: Return in about 4 weeks (around 02/18/2024) for LROB visit and PN2.   No future appointments.  No orders of the defined types were placed in this encounter.   Jamaal Bernasconi, DO Attending Obstetrician & Gynecologist, [facility] for [employer], [facility]

## 2024-02-14 ENCOUNTER — Ambulatory Visit
Admission: EM | Admit: 2024-02-14 | Discharge: 2024-02-14 | Disposition: A | Discharge status: Home / Self Care | Attending: Nurse Practitioner | Admitting: Nurse Practitioner

## 2024-02-14 DIAGNOSIS — B351 Tinea unguium: Secondary | ICD-10-CM

## 2024-02-14 MED ORDER — CICLOPIROX 8 % EX SOLN: Freq: Every day | CUTANEOUS | 0 refills | Status: AC

## 2024-02-14 NOTE — ED Triage Notes (Signed)
 Pt reports green colored pus on the right great toe x 1 mo. Started on one side of the toe but has spread across the toe.

## 2024-02-14 NOTE — Discharge Instructions (Signed)
 Apply medication as prescribed.  Please double check with your obstetrician to ensure this medication is safe during pregnancy. Make sure you keep the feet clean and dry. Wear shoes that fit appropriately, make sure you are wearing socks when you are wearing sneakers or other enclosed shoes. If symptoms fail to improve with this treatment, recommend follow-up with your primary care physician or with your obstetrician for further evaluation. Follow-up as needed.

## 2024-02-15 NOTE — ED Provider Notes (Signed)
 " [facility]    CSN: [id number] Arrival date & time: 02/14/24  1455      History   Chief Complaint No chief complaint on file.   HPI Jennifer Shaw is a 24 y.o. female.   The history is provided by the patient.   Patient presents for complaints of toenail discoloration to the right great toe.  Patient states symptoms have been present for the past month.  She states symptoms started on 1 side of the toe but has since spread across the entire lower half of the right great toe.  She denies pain, redness, swelling, or tenderness.  She states that she has noticed green pus on the right side of the toe.  She further denies injury or trauma.  Patient reports that she is approximately [redacted] weeks pregnant.  Past Medical History:  Diagnosis Date   Anxiety    Depression    Dysplasia    Headache    Hypothyroidism    RSV infection 12/13/2020   Scoliosis     Patient Active Problem List   Diagnosis Date Noted   Depression with anxiety & panic disorder 12/24/2023   Supervision of normal first pregnancy 10/01/2023   Acquired hypothyroidism 11/29/2020    Past Surgical History:  Procedure Laterality Date   BREAST REDUCTION SURGERY Bilateral 03/28/2021   Procedure: MAMMARY REDUCTION  (BREAST);  Surgeon: Marene Sieving, MD;  Location: [facility];  Service: Plastics;  Laterality: Bilateral;   craniostenosis repair     as infant   reconstructive surgery      OB History     Gravida  1   Para      Term      Preterm      AB      Living         SAB      IAB      Ectopic      Multiple      Live Births               Home Medications    Prior to Admission medications  Medication Sig Start Date End Date Taking? Authorizing Provider  ciclopirox  (PENLAC ) 8 % solution Apply topically at bedtime. Apply over nail and surrounding skin. Apply daily over previous coat. After seven (7) days, may remove with alcohol and continue cycle. 02/14/24  Yes  Leath-Warren, Etta PARAS, NP  Blood Pressure Monitor MISC For regular home bp monitoring during pregnancy 10/01/23   Loreli Suzen BIRCH, CNM  FLUoxetine  (PROZAC ) 40 MG capsule Take 1 capsule (40 mg total) by mouth daily. 06/19/23   Joesph Annabella HERO, FNP  levothyroxine  (SYNTHROID ) 75 MCG tablet Take 1 tablet (75 mcg total) by mouth daily before breakfast. 12/25/23   Loreli Suzen BIRCH, CNM  Prenatal Vit-Fe Fumarate-FA (PRENATAL VITAMIN PO) Take by mouth.    [provider]    Family History Family History  Problem Relation Age of Onset   Depression Mother    Alcohol abuse Father    Depression Sister    ADD / ADHD Maternal Grandmother    Anxiety disorder Maternal Grandmother    Cancer Maternal Grandmother        breast cancer   Depression Maternal Grandmother    Alcohol abuse Maternal Grandfather    Cancer Maternal Grandfather        lung cancer   COPD Maternal Grandfather    Alcohol abuse Paternal Grandmother    Alcohol abuse Paternal Actor  Social History Social History[1]   Allergies   Patient has no known allergies.   Review of Systems Review of Systems Per HPI  Physical Exam Triage Vital Signs ED Triage Vitals  Encounter Vitals Group     BP 02/14/24 1545 116/80     Girls Systolic BP Percentile --      Girls Diastolic BP Percentile --      Boys Systolic BP Percentile --      Boys Diastolic BP Percentile --      Pulse Rate 02/14/24 1545 89     Resp 02/14/24 1545 20     Temp 02/14/24 1545 98.3 F (36.8 C)     Temp Source 02/14/24 1545 Oral     SpO2 02/14/24 1545 98 %     Weight --      Height --      Head Circumference --      Peak Flow --      Pain Score 02/14/24 1548 0     Pain Loc --      Pain Education --      Exclude from Growth Chart --    No data found.  Updated Vital Signs BP 116/80 (BP Location: Right Arm)   Pulse 89   Temp 98.3 F (36.8 C) (Oral)   Resp 20   LMP 07/18/2023   SpO2 98%   Visual Acuity Right Eye Distance:    Left Eye Distance:   Bilateral Distance:    Right Eye Near:   Left Eye Near:    Bilateral Near:     Physical Exam Vitals and nursing note reviewed.  Constitutional:      General: She is not in acute distress.    Appearance: Normal appearance.  HENT:     Head: Normocephalic.  Eyes:     Extraocular Movements: Extraocular movements intact.     Pupils: Pupils are equal, round, and reactive to light.  Pulmonary:     Effort: Pulmonary effort is normal.  Musculoskeletal:     Cervical back: Normal range of motion.  Feet:     Right foot:     Skin integrity: Skin integrity normal.     Toenail Condition: Fungal disease present.    Comments: Fungal disease noted to the right great toe Skin:    General: Skin is warm and dry.  Neurological:     General: No focal deficit present.     Mental Status: She is alert and oriented to person, place, and time.  Psychiatric:        Mood and Affect: Mood normal.        Behavior: Behavior normal.      UC Treatments / Results  Labs (all labs ordered are listed, but only abnormal results are displayed) Labs Reviewed - No data to display  EKG   Radiology No results found.  Procedures Procedures (including critical care time)  Medications Ordered in UC Medications - No data to display  Initial Impression / Assessment and Plan / UC Course  I have reviewed the triage vital signs and the nursing notes.  Pertinent labs & imaging results that were available during my care of the patient were reviewed by me and considered in my medical decision making (see chart for details).  On exam, patient with fungal disease noted to the right great toe.  Treat with Penlac  8% solution.  Patient advised to follow-up with her obstetrician to ensure that the medication is safe for the pregnancy although this provider's  research indicated that it is.  Supportive care recommendations were provided and discussed with the patient to include keeping the feet  clean and dry, wearing properly fitted shoes, and to monitor for worsening.  Patient was advised to follow-up with her primary care physician if symptoms fail to improve.  Patient was in agreement with this plan of care and verbalizes understanding.  All questions were answered.  Patient stable for discharge.  Final Clinical Impressions(s) / UC Diagnoses   Final diagnoses:  Toenail fungus     Discharge Instructions      Apply medication as prescribed.  Please double check with your obstetrician to ensure this medication is safe during pregnancy. Make sure you keep the feet clean and dry. Wear shoes that fit appropriately, make sure you are wearing socks when you are wearing sneakers or other enclosed shoes. If symptoms fail to improve with this treatment, recommend follow-up with your primary care physician or with your obstetrician for further evaluation. Follow-up as needed.    ED Prescriptions     Medication Sig Dispense Auth. Provider   ciclopirox  (PENLAC ) 8 % solution Apply topically at bedtime. Apply over nail and surrounding skin. Apply daily over previous coat. After seven (7) days, may remove with alcohol and continue cycle. 6.6 mL Leath-Warren, Etta PARAS, NP      PDMP not reviewed this encounter.    [1]  Social History Tobacco Use   Smoking status: Never   Smokeless tobacco: Never  Vaping Use   Vaping status: Former   Start date: 04/10/2016   Substances: Nicotine  Substance Use Topics   Alcohol use: Not Currently    Comment: history of alcohol abuse-last drink 1 year ago   Drug use: Not Currently    Types: Marijuana    Comment: last used 3-4 years ago     Gilmer Etta PARAS, NP 02/15/24 [phone]  "

## 2024-02-18 ENCOUNTER — Encounter: Payer: Self-pay | Admitting: Women's Health

## 2024-02-18 ENCOUNTER — Ambulatory Visit: Admitting: Women's Health

## 2024-02-18 ENCOUNTER — Other Ambulatory Visit

## 2024-02-18 VITALS — BP 93/69 | HR 108 | Wt 216.8 lb

## 2024-02-18 DIAGNOSIS — O99891 Other specified diseases and conditions complicating pregnancy: Secondary | ICD-10-CM | POA: Diagnosis not present

## 2024-02-18 DIAGNOSIS — R42 Dizziness and giddiness: Secondary | ICD-10-CM | POA: Diagnosis not present

## 2024-02-18 DIAGNOSIS — H538 Other visual disturbances: Secondary | ICD-10-CM

## 2024-02-18 DIAGNOSIS — Z1332 Encounter for screening for maternal depression: Secondary | ICD-10-CM

## 2024-02-18 DIAGNOSIS — Z3A28 28 weeks gestation of pregnancy: Secondary | ICD-10-CM

## 2024-02-18 DIAGNOSIS — Z3403 Encounter for supervision of normal first pregnancy, third trimester: Secondary | ICD-10-CM

## 2024-02-18 DIAGNOSIS — O99283 Endocrine, nutritional and metabolic diseases complicating pregnancy, third trimester: Secondary | ICD-10-CM | POA: Diagnosis not present

## 2024-02-18 DIAGNOSIS — Z131 Encounter for screening for diabetes mellitus: Secondary | ICD-10-CM

## 2024-02-18 DIAGNOSIS — M25559 Pain in unspecified hip: Secondary | ICD-10-CM | POA: Diagnosis not present

## 2024-02-18 DIAGNOSIS — E039 Hypothyroidism, unspecified: Secondary | ICD-10-CM | POA: Diagnosis not present

## 2024-02-18 DIAGNOSIS — O0993 Supervision of high risk pregnancy, unspecified, third trimester: Secondary | ICD-10-CM

## 2024-02-18 NOTE — Progress Notes (Signed)
 "   LOW-RISK PREGNANCY VISIT Patient name: Jennifer Shaw MRN [id number]  Date of birth: May 26, 2000 Chief Complaint:   Routine Prenatal Visit (Hip pain, seeing black spots)  History of Present Illness:   Jennifer Shaw is a 24 y.o. G1P0 female at [redacted]w[redacted]d with an Estimated Date of Delivery: 05/08/24 being seen today for ongoing management of a low-risk pregnancy.   Today she reports Lt hip pain. Dizzy episodes, sees black spots. Contractions: Not present. Vag. Bleeding: None.  Movement: Present. denies leaking of fluid.     02/18/2024    9:13 AM 10/01/2023    9:37 AM 08/29/2023   10:11 AM 08/01/2023    3:03 PM 07/04/2023    2:24 PM  Depression screen PHQ 2/9  Decreased Interest 3 3 1 1 1   Down, Depressed, Hopeless 0 0 0 0 0  PHQ - 2 Score 3 3 1 1 1   Altered sleeping 1 2 2  0 0  Tired, decreased energy 2 3 3 2 2   Change in appetite 0 0 0 0 2  Feeling bad or failure about yourself  0 0 0 0 0  Trouble concentrating 0 0 0 0 0  Moving slowly or fidgety/restless 0 0 0 0 0  Suicidal thoughts 0 0 0 0 0  PHQ-9 Score 6 8  6  3  5    Difficult doing work/chores   Not difficult at all Not difficult at all Somewhat difficult     Data saved with a previous flowsheet row definition        02/18/2024    9:14 AM 10/01/2023    9:37 AM 08/29/2023   10:12 AM 08/01/2023    3:03 PM  GAD 7 : Generalized Anxiety Score  Nervous, Anxious, on Edge 0 0 0 1  Control/stop worrying 0 0 0 1  Worry too much - different things 0 0 0 0  Trouble relaxing 0 3 0 2  Restless 0 0 0 0  Easily annoyed or irritable 3 3 0 3  Afraid - awful might happen 0 0 0 0  Total GAD 7 Score 3 6 0 7  Anxiety Difficulty   Not difficult at all Not difficult at all      Review of Systems:   Pertinent items are noted in HPI Denies abnormal vaginal discharge w/ itching/odor/irritation, headaches, visual changes, shortness of breath, chest pain, abdominal pain, severe nausea/vomiting, or problems with urination or bowel movements unless  otherwise stated above. Pertinent History Reviewed:  Reviewed past medical,surgical, social, obstetrical and family history.  Reviewed problem list, medications and allergies. Physical Assessment:   Vitals:   02/18/24 0911  BP: 93/69  Pulse: (!) 108  Weight: 216 lb 12.8 oz (98.3 kg)  Body mass index is 33.96 kg/m.        Physical Examination:   General appearance: Well appearing, and in no distress  Mental status: Alert, oriented to person, place, and time  Skin: Warm & dry  Cardiovascular: Normal heart rate noted  Respiratory: Normal respiratory effort, no distress  Abdomen: Soft, gravid, nontender  Pelvic: Cervical exam deferred         Extremities: Edema: None  Fetal Status: Fetal Heart Rate (bpm): 158 Fundal Height: 27 cm Movement: Present    Chaperone: N/A No results found for this or any previous visit (from the past 24 hours).  Assessment & Plan:  1) Low-risk pregnancy G1P0 at [redacted]w[redacted]d with an Estimated Date of Delivery: 05/08/24   2) Hip pain, discussed  3)  Dizzy spells/sees spots> discussed common reasons, tips/trips, gave printed info. Checking hgb today  4) Hypothyroid> on synthroid  , check TSH today   Meds: No orders of the defined types were placed in this encounter.  Labs/procedures today: PN2, declined flu shot, and declined Tdap  Plan:  Continue routine obstetrical care  Next visit: prefers in person    Reviewed: Preterm labor symptoms and general obstetric precautions including but not limited to vaginal bleeding, contractions, leaking of fluid and fetal movement were reviewed in detail with the patient.  All questions were answered. Does have home bp cuff. Office bp cuff given: not applicable. Check bp weekly, let us  know if consistently >140 and/or >90.  Follow-up: Return in about 4 weeks (around 03/17/2024) for LROB, CNM, in person.  No future appointments.  Orders Placed This Encounter  Procedures   TSH   Suzen JONELLE Fetters CNM,  [facility] 02/18/2024 9:41 AM  "

## 2024-02-18 NOTE — Patient Instructions (Addendum)
 Jennifer Shaw, thank you for choosing our office today! We appreciate the opportunity to meet your healthcare needs. You may receive a short survey by mail, e-mail, or through [employer]. If you are happy with your care we would appreciate if you could take just a few minutes to complete the survey questions. We read all of your comments and take your feedback very seriously. Thank you again for choosing our office.  Center for [employer] Team at [facility]  [facility] at [facility] ([address] [place], [place] [address]) Entrance C, located off of [address] Free 24/7 valet parking   For Dizzy Spells:  This is usually related to either your blood sugar or your blood pressure dropping Make sure you are staying well hydrated and drinking enough water so that your urine is clear Eat small frequent meals and snacks containing protein (meat, eggs, nuts, cheese) so that your blood sugar doesn't drop If you do get dizzy, sit/lay down and get you something to drink and a snack containing protein- you will usually start feeling better in 10-20 minutes    CLASSES: Go to [email or web address] to register for classes (childbirth, breastfeeding, waterbirth, infant CPR, daddy bootcamp, etc.)  Call the office [phone]) or go to [facility] if: You begin to have strong, frequent contractions Your water breaks.  Sometimes it is a big gush of fluid, sometimes it is just a trickle that keeps getting your panties wet or running down your legs You have vaginal bleeding.  It is normal to have a small amount of spotting if your cervix was checked.  You don't feel your baby moving like normal.  If you don't, get you something to eat and drink and lay down and focus on feeling your baby move.   If your baby is still not moving like normal, you should call the office or go to [facility].  Call the office [phone]) or go to [facility] hospital for these signs of pre-eclampsia: Severe headache  that does not go away with Tylenol Visual changes- seeing spots, double, blurred vision Pain under your right breast or upper abdomen that does not go away with Tums or heartburn medicine Nausea and/or vomiting Severe swelling in your hands, feet, and face   Tdap Vaccine It is recommended that you get the Tdap vaccine during the third trimester of EACH pregnancy to help protect your baby from getting pertussis (whooping cough) 27-36 weeks is the BEST time to do this so that you can pass the protection on to your baby. During pregnancy is better than after pregnancy, but if you are unable to get it during pregnancy it will be offered at the hospital.  You can get this vaccine with us , at the health department, your family doctor, or some local pharmacies Everyone who will be around your baby should also be up-to-date on their vaccines before the baby comes. Adults (who are not pregnant) only need 1 dose of Tdap during adulthood.   [facility] Pediatricians/Family Doctors [facility]): [address] [place], [phone]           [facility]: [address], [phone]                [facility]): [address], [phone] (call to ask if accepting patients) [facility] Department: [address], [place], [phone]    [facility] Pediatricians/Family Doctors [facility]): [phone],  [address], [phone] [facility]: [address] [place], [phone] [facility]: [address], [phone]  [facility] Doctors  [facility]): [phone] [facility]: [address], [phone]   [facility] Doctors [facility]: [address], [phone]  [facility] Doctors  [facility]: [phone], [phone]  Home Blood Pressure Monitoring for  Patients   Your provider has recommended that you check your blood pressure (BP) at least once a week at home. If you do not have a blood pressure cuff at home, one will be provided for you. Contact your provider if you have not received your monitor within 1 week.   Helpful Tips for Accurate Home Blood Pressure Checks  Don't smoke, exercise, or drink caffeine 30 minutes before checking your BP Use the restroom before checking your BP (a full bladder can raise your pressure) Relax in a comfortable upright chair Feet on the ground Left arm resting comfortably on a flat surface at the level of your heart Legs uncrossed Back supported Sit quietly and don't talk Place the cuff on your bare arm Adjust snuggly, so that only two fingertips can fit between your skin and the top of the cuff Check 2 readings separated by at least one minute Keep a log of your BP readings For a visual, please reference this diagram: http://ccnc.care/bpdiagram  Provider Name: [facility]     Phone: [phone]  Zone 1: ALL CLEAR  Continue to monitor your symptoms:  BP reading is less than 140 (top number) or less than 90 (bottom number)  No right upper stomach pain No headaches or seeing spots No feeling nauseated or throwing up No swelling in face and hands  Zone 2: CAUTION Call your doctor's office for any of the following:  BP reading is greater than 140 (top number) or greater than 90 (bottom number)  Stomach pain under your ribs in the middle or right side Headaches or seeing spots Feeling nauseated or throwing up Swelling in face and hands  Zone 3: EMERGENCY  Seek immediate medical care if you have any of the following:  BP reading is greater than160 (top number) or greater than 110 (bottom number) Severe headaches not improving with Tylenol Serious difficulty catching your breath Any worsening symptoms from Zone 2   Third Trimester of Pregnancy The third trimester is from week 29  through week 42, months 7 through 9. The third trimester is a time when the fetus is growing rapidly. At the end of the ninth month, the fetus is about 20 inches in length and weighs 6-10 pounds.  BODY CHANGES Your body goes through many changes during pregnancy. The changes vary from woman to woman.  Your weight will continue to increase. You can expect to gain 25-35 pounds (11-16 kg) by the end of the pregnancy. You may begin to get stretch marks on your hips, abdomen, and breasts. You may urinate more often because the fetus is moving lower into your pelvis and pressing on your bladder. You may develop or continue to have heartburn as a result of your pregnancy. You may develop constipation because certain hormones are causing the muscles that push waste through your intestines to slow down. You may develop hemorrhoids or swollen, bulging veins (varicose veins). You may have pelvic pain because of the weight gain and pregnancy hormones relaxing your joints between the bones in your pelvis. Backaches may result from  overexertion of the muscles supporting your posture. You may have changes in your hair. These can include thickening of your hair, rapid growth, and changes in texture. Some women also have hair loss during or after pregnancy, or hair that feels dry or thin. Your hair will most likely return to normal after your baby is born. Your breasts will continue to grow and be tender. A yellow discharge may leak from your breasts called colostrum. Your belly button may stick out. You may feel short of breath because of your expanding uterus. You may notice the fetus dropping, or moving lower in your abdomen. You may have a bloody mucus discharge. This usually occurs a few days to a week before labor begins. Your cervix becomes thin and soft (effaced) near your due date. WHAT TO EXPECT AT YOUR PRENATAL EXAMS  You will have prenatal exams every 2 weeks until week 36. Then, you will have weekly  prenatal exams. During a routine prenatal visit: You will be weighed to make sure you and the fetus are growing normally. Your blood pressure is taken. Your abdomen will be measured to track your baby's growth. The fetal heartbeat will be listened to. Any test results from the previous visit will be discussed. You may have a cervical check near your due date to see if you have effaced. At around 36 weeks, your caregiver will check your cervix. At the same time, your caregiver will also perform a test on the secretions of the vaginal tissue. This test is to determine if a type of bacteria, Group B streptococcus, is present. Your caregiver will explain this further. Your caregiver may ask you: What your birth plan is. How you are feeling. If you are feeling the baby move. If you have had any abnormal symptoms, such as leaking fluid, bleeding, severe headaches, or abdominal cramping. If you have any questions. Other tests or screenings that may be performed during your third trimester include: Blood tests that check for low iron levels (anemia). Fetal testing to check the health, activity level, and growth of the fetus. Testing is done if you have certain medical conditions or if there are problems during the pregnancy. FALSE LABOR You may feel small, irregular contractions that eventually go away. These are called Braxton Hicks contractions, or false labor. Contractions may last for hours, days, or even weeks before true labor sets in. If contractions come at regular intervals, intensify, or become painful, it is best to be seen by your caregiver.  SIGNS OF LABOR  Menstrual-like cramps. Contractions that are 5 minutes apart or less. Contractions that start on the top of the uterus and spread down to the lower abdomen and back. A sense of increased pelvic pressure or back pain. A watery or bloody mucus discharge that comes from the vagina. If you have any of these signs before the 37th week of  pregnancy, call your caregiver right away. You need to go to the hospital to get checked immediately. HOME CARE INSTRUCTIONS  Avoid all smoking, herbs, alcohol, and unprescribed drugs. These chemicals affect the formation and growth of the baby. Follow your caregiver's instructions regarding medicine use. There are medicines that are either safe or unsafe to take during pregnancy. Exercise only as directed by your caregiver. Experiencing uterine cramps is a good sign to stop exercising. Continue to eat regular, healthy meals. Wear a good support bra for breast tenderness. Do not use hot tubs, steam rooms, or saunas. Wear your seat belt at all times when  driving. Avoid raw meat, uncooked cheese, cat litter boxes, and soil used by cats. These carry germs that can cause birth defects in the baby. Take your prenatal vitamins. Try taking a stool softener (if your caregiver approves) if you develop constipation. Eat more high-fiber foods, such as fresh vegetables or fruit and whole grains. Drink plenty of fluids to keep your urine clear or pale yellow. Take warm sitz baths to soothe any pain or discomfort caused by hemorrhoids. Use hemorrhoid cream if your caregiver approves. If you develop varicose veins, wear support hose. Elevate your feet for 15 minutes, 3-4 times a day. Limit salt in your diet. Avoid heavy lifting, wear low heal shoes, and practice good posture. Rest a lot with your legs elevated if you have leg cramps or low back pain. Visit your dentist if you have not gone during your pregnancy. Use a soft toothbrush to brush your teeth and be gentle when you floss. A sexual relationship may be continued unless your caregiver directs you otherwise. Do not travel far distances unless it is absolutely necessary and only with the approval of your caregiver. Take prenatal classes to understand, practice, and ask questions about the labor and delivery. Make a trial run to the hospital. Pack your  hospital bag. Prepare the baby's nursery. Continue to go to all your prenatal visits as directed by your caregiver. SEEK MEDICAL CARE IF: You are unsure if you are in labor or if your water has broken. You have dizziness. You have mild pelvic cramps, pelvic pressure, or nagging pain in your abdominal area. You have persistent nausea, vomiting, or diarrhea. You have a bad smelling vaginal discharge. You have pain with urination. SEEK IMMEDIATE MEDICAL CARE IF:  You have a fever. You are leaking fluid from your vagina. You have spotting or bleeding from your vagina. You have severe abdominal cramping or pain. You have rapid weight loss or gain. You have shortness of breath with chest pain. You notice sudden or extreme swelling of your face, hands, ankles, feet, or legs. You have not felt your baby move in over an hour. You have severe headaches that do not go away with medicine. You have vision changes. Document Released: 01/22/2001 Document Revised: 02/02/2013 Document Reviewed: 03/31/2012 [facility] Patient Information 2015 [place], [place]. This information is not intended to replace advice given to you by your health care provider. Make sure you discuss any questions you have with your health care provider.

## 2024-02-19 ENCOUNTER — Ambulatory Visit: Payer: Self-pay | Admitting: Women's Health

## 2024-02-19 DIAGNOSIS — E039 Hypothyroidism, unspecified: Secondary | ICD-10-CM

## 2024-02-19 LAB — CBC
Hematocrit: 37 % (ref 34.0–46.6)
Hemoglobin: 11.9 g/dL (ref 11.1–15.9)
MCH: 30.3 pg (ref 26.6–33.0)
MCHC: 32.2 g/dL (ref 31.5–35.7)
MCV: 94 fL (ref 79–97)
Platelets: 221 x10E3/uL (ref 150–450)
RBC: 3.93 x10E6/uL (ref 3.77–5.28)
RDW: 11.9 % (ref 11.7–15.4)
WBC: 7.4 x10E3/uL (ref 3.4–10.8)

## 2024-02-19 LAB — GLUCOSE TOLERANCE, 2 HOURS W/ 1HR
Glucose, 1 hour: 126 mg/dL (ref 70–179)
Glucose, 2 hour: 127 mg/dL (ref 70–152)
Glucose, Fasting: 78 mg/dL (ref 70–91)

## 2024-02-19 LAB — SYPHILIS: RPR W/REFLEX TO RPR TITER AND TREPONEMAL ANTIBODIES, TRADITIONAL SCREENING AND DIAGNOSIS ALGORITHM: RPR Ser Ql: NONREACTIVE

## 2024-02-19 LAB — TSH: TSH: 7.83 u[IU]/mL — ABNORMAL HIGH (ref 0.450–4.500)

## 2024-02-19 LAB — ANTIBODY SCREEN: Antibody Screen: NEGATIVE

## 2024-02-19 LAB — HIV ANTIBODY (ROUTINE TESTING W REFLEX): HIV Screen 4th Generation wRfx: NONREACTIVE

## 2024-02-20 ENCOUNTER — Other Ambulatory Visit: Payer: Self-pay | Admitting: Obstetrics and Gynecology

## 2024-02-20 MED ORDER — LEVOTHYROXINE SODIUM 75 MCG PO TABS: 75.0000 ug | ORAL_TABLET | Freq: Every day | ORAL | 3 refills | Status: DC

## 2024-02-27 NOTE — Telephone Encounter (Signed)
 Called patient regarding TSH but number not in service.

## 2024-03-15 ENCOUNTER — Other Ambulatory Visit: Payer: Self-pay

## 2024-03-15 ENCOUNTER — Inpatient Hospital Stay (HOSPITAL_COMMUNITY)

## 2024-03-15 ENCOUNTER — Encounter (HOSPITAL_COMMUNITY): Payer: Self-pay

## 2024-03-15 ENCOUNTER — Inpatient Hospital Stay (HOSPITAL_COMMUNITY)
Admission: EM | Admit: 2024-03-15 | Discharge: 2024-03-16 | Disposition: A | Discharge status: Home / Self Care | Attending: Emergency Medicine | Admitting: Emergency Medicine

## 2024-03-15 DIAGNOSIS — O9A213 Injury, poisoning and certain other consequences of external causes complicating pregnancy, third trimester: Secondary | ICD-10-CM

## 2024-03-15 DIAGNOSIS — Z3689 Encounter for other specified antenatal screening: Secondary | ICD-10-CM

## 2024-03-15 DIAGNOSIS — Z3A32 32 weeks gestation of pregnancy: Secondary | ICD-10-CM | POA: Insufficient documentation

## 2024-03-15 DIAGNOSIS — W19XXXA Unspecified fall, initial encounter: Secondary | ICD-10-CM

## 2024-03-15 DIAGNOSIS — R0781 Pleurodynia: Secondary | ICD-10-CM | POA: Insufficient documentation

## 2024-03-15 DIAGNOSIS — E039 Hypothyroidism, unspecified: Secondary | ICD-10-CM | POA: Insufficient documentation

## 2024-03-15 DIAGNOSIS — W000XXA Fall on same level due to ice and snow, initial encounter: Secondary | ICD-10-CM | POA: Insufficient documentation

## 2024-03-15 DIAGNOSIS — S3991XA Unspecified injury of abdomen, initial encounter: Secondary | ICD-10-CM | POA: Insufficient documentation

## 2024-03-15 DIAGNOSIS — Z79899 Other long term (current) drug therapy: Secondary | ICD-10-CM | POA: Insufficient documentation

## 2024-03-15 LAB — URINALYSIS, ROUTINE W REFLEX MICROSCOPIC
Bacteria, UA: NONE SEEN
Bilirubin Urine: NEGATIVE
Glucose, UA: NEGATIVE mg/dL
Hgb urine dipstick: NEGATIVE
Ketones, ur: NEGATIVE mg/dL
Nitrite: NEGATIVE
Protein, ur: NEGATIVE mg/dL
Specific Gravity, Urine: 1.015 (ref 1.005–1.030)
pH: 7 (ref 5.0–8.0)

## 2024-03-15 LAB — CBG MONITORING, ED: Glucose-Capillary: 101 mg/dL — ABNORMAL HIGH (ref 70–99)

## 2024-03-15 MED ORDER — CYCLOBENZAPRINE HCL 10 MG PO TABS
10.0000 mg | ORAL_TABLET | Freq: Once | ORAL | Status: AC
  Administered 2024-03-15: 10 mg via ORAL
  Filled 2024-03-15: qty 1

## 2024-03-15 MED ORDER — ACETAMINOPHEN 500 MG PO TABS
1000.0000 mg | ORAL_TABLET | Freq: Once | ORAL | Status: AC
  Administered 2024-03-15: 1000 mg via ORAL
  Filled 2024-03-15: qty 2

## 2024-03-15 MED ORDER — SODIUM CHLORIDE 0.9 % IV BOLUS
1000.0000 mL | Freq: Once | INTRAVENOUS | Status: AC
  Administered 2024-03-15: 1000 mL via INTRAVENOUS

## 2024-03-15 NOTE — MAU Note (Signed)
 Jennifer Shaw is a 24 y.o. at [redacted]w[redacted]d here in MAU reporting: was coming home from [facility] around 1715 when she slipped and fell on the ice. She fell to her left side on her hand, abdomen and hip. Reports baby has not been as active as usual since the fall. Denies VB, LOF. Reports some tightening in abdomen.    Pain score: 6/10 abdomen, 5/10 hip Vitals:   03/15/24 1900 03/15/24 2009  BP: 120/81 133/74  Pulse: 93 91  Resp:  18  Temp:  98.2 F (36.8 C)  SpO2: 99% 99%     FHT: 150

## 2024-03-15 NOTE — Progress Notes (Signed)
 Shift report received from Ronal Doing, RN. Pt not on monitor. Called [facility], pt being loaded into ambulance.

## 2024-03-15 NOTE — Progress Notes (Signed)
 Dr. Zina is on the unit and has been given report on the pt. He wants the pt transferred here for further monitoring and evaluation. [facility] RN notified.

## 2024-03-15 NOTE — ED Notes (Addendum)
[facility] called to transport patient. Nurse aware.

## 2024-03-15 NOTE — ED Triage Notes (Signed)
 Pt arrived via POV c/o falling on the ice. Pt is [redacted] weeks pregnant and fell on her stomach. After getting into house, stomach pain kept getting worse with pressure on her abdomen radiates back towards her buttocks.

## 2024-03-15 NOTE — Discharge Instructions (Signed)

## 2024-03-17 ENCOUNTER — Other Ambulatory Visit (HOSPITAL_COMMUNITY)
Admission: RE | Admit: 2024-03-17 | Discharge: 2024-03-17 | Disposition: A | Source: Ambulatory Visit | Attending: Advanced Practice Midwife | Admitting: Advanced Practice Midwife

## 2024-03-17 ENCOUNTER — Ambulatory Visit: Admitting: Advanced Practice Midwife

## 2024-03-17 VITALS — BP 118/78 | HR 79 | Wt 228.5 lb

## 2024-03-17 DIAGNOSIS — E039 Hypothyroidism, unspecified: Secondary | ICD-10-CM

## 2024-03-17 DIAGNOSIS — Z3A32 32 weeks gestation of pregnancy: Secondary | ICD-10-CM

## 2024-03-17 DIAGNOSIS — O99283 Endocrine, nutritional and metabolic diseases complicating pregnancy, third trimester: Secondary | ICD-10-CM

## 2024-03-17 DIAGNOSIS — N898 Other specified noninflammatory disorders of vagina: Secondary | ICD-10-CM | POA: Diagnosis not present

## 2024-03-17 NOTE — Progress Notes (Signed)
" ° °  LOW-RISK PREGNANCY VISIT Patient name: Jennifer Shaw MRN [id number]  Date of birth: May 31, 2000 Chief Complaint:   Routine Prenatal Visit  History of Present Illness:   Jennifer Shaw is a 24 y.o. G1P0 female at [redacted]w[redacted]d with an Estimated Date of Delivery: 05/08/24 being seen today for ongoing management of a low-risk pregnancy.  Today she reports s/p fall on 2/2 on the ice; had MAU workup which was negative; doing better now, but reports some constipation/sharp pain in buttocks; also with vag d/c (no odor, no itch); has been taking Synthroid  daily. Contractions: Irritability. Vag. Bleeding: None.  Movement: Present. denies leaking of fluid. Review of Systems:   Pertinent items are noted in HPI Denies abnormal vaginal discharge w/ itching/odor/irritation, headaches, visual changes, shortness of breath, chest pain, abdominal pain, severe nausea/vomiting, or problems with urination or bowel movements unless otherwise stated above. Pertinent History Reviewed:  Reviewed past medical,surgical, social, obstetrical and family history.  Reviewed problem list, medications and allergies. Physical Assessment:   Vitals:   03/17/24 0853  BP: 118/78  Pulse: 79  Weight: 228 lb 8 oz (103.6 kg)  Body mass index is 33.74 kg/m.        Physical Examination:   General appearance: Well appearing, and in no distress  Mental status: Alert, oriented to person, place, and time  Skin: Warm & dry  Cardiovascular: Normal heart rate noted  Respiratory: Normal respiratory effort, no distress  Abdomen: Soft, gravid, nontender  Pelvic: Cervical exam deferred         Extremities:    Fetal Status: Fetal Heart Rate (bpm): 143 Fundal Height: 32 cm Movement: Present    No results found for this or any previous visit (from the past 24 hours).  Assessment & Plan:  1) Low-risk pregnancy G1P0 at [redacted]w[redacted]d with an Estimated Date of Delivery: 05/08/24   2) Dep/anx, stopped Prozac  at 30wks for fetal reasons; doing well  3)  Hypothyroidism, now taking Synthorid 75mcg daily so will recheck TSH today (was missing doses previously); reports having to lie down after taking and also feels sweaty at times which she attributed to side effect from meds (issue rev'd)  4) Vag d/c, no itching or odor; CV swab today which will serve as 3rd trimester GC/chlam    Meds: No orders of the defined types were placed in this encounter.  Labs/procedures today: CV; TSH  Plan:  Continue routine obstetrical care   Reviewed: Preterm labor symptoms and general obstetric precautions including but not limited to vaginal bleeding, contractions, leaking of fluid and fetal movement were reviewed in detail with the patient.  All questions were answered. Has home bp cuff. Check bp weekly, let us  know if >140/90.   Follow-up: Return for 2wk LROB; 4wk LROB w GBS/cultures.  Orders Placed This Encounter  Procedures   TSH   Suzen JONETTA Gentry [facility] 03/17/2024 9:21 AM  "

## 2024-03-17 NOTE — Patient Instructions (Signed)
 Jennifer Shaw, thank you for choosing our office today! We appreciate the opportunity to meet your healthcare needs. You may receive a short survey by mail, e-mail, or through [employer]. If you are happy with your care we would appreciate if you could take just a few minutes to complete the survey questions. We read all of your comments and take your feedback very seriously. Thank you again for choosing our office.  Center for [employer] Team at [facility]  [facility] at [facility] ([address] [place], [place] [address]) Entrance C, located off of [address] Free 24/7 valet parking   CLASSES: Go to [email or web address] to register for classes (childbirth, breastfeeding, waterbirth, infant CPR, daddy bootcamp, etc.)  Call the office [phone]) or go to [facility] if: You begin to have strong, frequent contractions Your water breaks.  Sometimes it is a big gush of fluid, sometimes it is just a trickle that keeps getting your panties wet or running down your legs You have vaginal bleeding.  It is normal to have a small amount of spotting if your cervix was checked.  You don't feel your baby moving like normal.  If you don't, get you something to eat and drink and lay down and focus on feeling your baby move.   If your baby is still not moving like normal, you should call the office or go to [facility].  Call the office [phone]) or go to [facility] hospital for these signs of pre-eclampsia: Severe headache that does not go away with Tylenol  Visual changes- seeing spots, double, blurred vision Pain under your right breast or upper abdomen that does not go away with Tums or heartburn medicine Nausea and/or vomiting Severe swelling in your hands, feet, and face   Tdap Vaccine It is recommended that you get the Tdap vaccine during the third trimester of EACH pregnancy to help protect your baby from getting pertussis (whooping cough) 27-36 weeks is the BEST time to do this  so that you can pass the protection on to your baby. During pregnancy is better than after pregnancy, but if you are unable to get it during pregnancy it will be offered at the hospital.  You can get this vaccine with us , at the health department, your family doctor, or some local pharmacies Everyone who will be around your baby should also be up-to-date on their vaccines before the baby comes. Adults (who are not pregnant) only need 1 dose of Tdap during adulthood.   [facility] Pediatricians/Family Doctors [facility]): [address] Luba BROCKS, [phone]           [facility]: [address], [phone]                [facility]): [address], [phone] (call to ask if accepting patients) [facility] Department: [address], [place], [phone]    [facility] Pediatricians/Family Doctors [facility]): [phone], [phone] [facility]: [address] [place], [phone] [facility]: [address], [phone]  [facility] Doctors  [facility]): [phone] [facility]: [address] [phone]   [facility] Doctors [facility]: [address], [phone]  [facility] Family Doctors  [facility]: [phone], [phone]  Home Blood Pressure Monitoring for Patients   Your provider has recommended that you check your  blood pressure (BP) at least once a week at home. If you do not have a blood pressure cuff at home, one will be provided for you. Contact your provider if you have not received your monitor within 1 week.   Helpful Tips for Accurate Home Blood Pressure Checks  Don't smoke, exercise, or drink caffeine 30 minutes before checking your BP Use the restroom before checking your BP (a full bladder can raise your  pressure) Relax in a comfortable upright chair Feet on the ground Left arm resting comfortably on a flat surface at the level of your heart Legs uncrossed Back supported Sit quietly and don't talk Place the cuff on your bare arm Adjust snuggly, so that only two fingertips can fit between your skin and the top of the cuff Check 2 readings separated by at least one minute Keep a log of your BP readings For a visual, please reference this diagram: http://ccnc.care/bpdiagram  Provider Name: [facility]     Phone: [phone]  Zone 1: ALL CLEAR  Continue to monitor your symptoms:  BP reading is less than 140 (top number) or less than 90 (bottom number)  No right upper stomach pain No headaches or seeing spots No feeling nauseated or throwing up No swelling in face and hands  Zone 2: CAUTION Call your doctor's office for any of the following:  BP reading is greater than 140 (top number) or greater than 90 (bottom number)  Stomach pain under your ribs in the middle or right side Headaches or seeing spots Feeling nauseated or throwing up Swelling in face and hands  Zone 3: EMERGENCY  Seek immediate medical care if you have any of the following:  BP reading is greater than160 (top number) or greater than 110 (bottom number) Severe headaches not improving with Tylenol  Serious difficulty catching your breath Any worsening symptoms from Zone 2   Third Trimester of Pregnancy The third trimester is from week 29 through week 42, months 7 through 9. The third trimester is a time when the fetus is growing rapidly. At the end of the ninth month, the fetus is about 20 inches in length and weighs 6-10 pounds.  BODY CHANGES Your body goes through many changes during pregnancy. The changes vary from woman to woman.  Your weight will continue to increase. You can expect to gain 25-35 pounds (11-16 kg) by the end of the pregnancy. You may begin to get stretch marks on your hips, abdomen,  and breasts. You may urinate more often because the fetus is moving lower into your pelvis and pressing on your bladder. You may develop or continue to have heartburn as a result of your pregnancy. You may develop constipation because certain hormones are causing the muscles that push waste through your intestines to slow down. You may develop hemorrhoids or swollen, bulging veins (varicose veins). You may have pelvic pain because of the weight gain and pregnancy hormones relaxing your joints between the bones in your pelvis. Backaches may result from overexertion of the muscles supporting your posture. You may have changes in your hair. These can include thickening of your hair, rapid growth, and changes in texture. Some women also have hair loss during or after pregnancy, or hair that feels dry or thin. Your hair will most likely return to normal after your baby is born. Your breasts will continue to grow and be tender. A yellow discharge may leak from your breasts called colostrum. Your belly button may stick out. You may  feel short of breath because of your expanding uterus. You may notice the fetus dropping, or moving lower in your abdomen. You may have a bloody mucus discharge. This usually occurs a few days to a week before labor begins. Your cervix becomes thin and soft (effaced) near your due date. WHAT TO EXPECT AT YOUR PRENATAL EXAMS  You will have prenatal exams every 2 weeks until week 36. Then, you will have weekly prenatal exams. During a routine prenatal visit: You will be weighed to make sure you and the fetus are growing normally. Your blood pressure is taken. Your abdomen will be measured to track your baby's growth. The fetal heartbeat will be listened to. Any test results from the previous visit will be discussed. You may have a cervical check near your due date to see if you have effaced. At around 36 weeks, your caregiver will check your cervix. At the same time, your  caregiver will also perform a test on the secretions of the vaginal tissue. This test is to determine if a type of bacteria, Group B streptococcus, is present. Your caregiver will explain this further. Your caregiver may ask you: What your birth plan is. How you are feeling. If you are feeling the baby move. If you have had any abnormal symptoms, such as leaking fluid, bleeding, severe headaches, or abdominal cramping. If you have any questions. Other tests or screenings that may be performed during your third trimester include: Blood tests that check for low iron levels (anemia). Fetal testing to check the health, activity level, and growth of the fetus. Testing is done if you have certain medical conditions or if there are problems during the pregnancy. FALSE LABOR You may feel small, irregular contractions that eventually go away. These are called Braxton Hicks contractions, or false labor. Contractions may last for hours, days, or even weeks before true labor sets in. If contractions come at regular intervals, intensify, or become painful, it is best to be seen by your caregiver.  SIGNS OF LABOR  Menstrual-like cramps. Contractions that are 5 minutes apart or less. Contractions that start on the top of the uterus and spread down to the lower abdomen and back. A sense of increased pelvic pressure or back pain. A watery or bloody mucus discharge that comes from the vagina. If you have any of these signs before the 37th week of pregnancy, call your caregiver right away. You need to go to the hospital to get checked immediately. HOME CARE INSTRUCTIONS  Avoid all smoking, herbs, alcohol, and unprescribed drugs. These chemicals affect the formation and growth of the baby. Follow your caregiver's instructions regarding medicine use. There are medicines that are either safe or unsafe to take during pregnancy. Exercise only as directed by your caregiver. Experiencing uterine cramps is a good sign to  stop exercising. Continue to eat regular, healthy meals. Wear a good support bra for breast tenderness. Do not use hot tubs, steam rooms, or saunas. Wear your seat belt at all times when driving. Avoid raw meat, uncooked cheese, cat litter boxes, and soil used by cats. These carry germs that can cause birth defects in the baby. Take your prenatal vitamins. Try taking a stool softener (if your caregiver approves) if you develop constipation. Eat more high-fiber foods, such as fresh vegetables or fruit and whole grains. Drink plenty of fluids to keep your urine clear or pale yellow. Take warm sitz baths to soothe any pain or discomfort caused by hemorrhoids. Use hemorrhoid cream if  your caregiver approves. If you develop varicose veins, wear support hose. Elevate your feet for 15 minutes, 3-4 times a day. Limit salt in your diet. Avoid heavy lifting, wear low heal shoes, and practice good posture. Rest a lot with your legs elevated if you have leg cramps or low back pain. Visit your dentist if you have not gone during your pregnancy. Use a soft toothbrush to brush your teeth and be gentle when you floss. A sexual relationship may be continued unless your caregiver directs you otherwise. Do not travel far distances unless it is absolutely necessary and only with the approval of your caregiver. Take prenatal classes to understand, practice, and ask questions about the labor and delivery. Make a trial run to the hospital. Pack your hospital bag. Prepare the baby's nursery. Continue to go to all your prenatal visits as directed by your caregiver. SEEK MEDICAL CARE IF: You are unsure if you are in labor or if your water has broken. You have dizziness. You have mild pelvic cramps, pelvic pressure, or nagging pain in your abdominal area. You have persistent nausea, vomiting, or diarrhea. You have a bad smelling vaginal discharge. You have pain with urination. SEEK IMMEDIATE MEDICAL CARE IF:  You  have a fever. You are leaking fluid from your vagina. You have spotting or bleeding from your vagina. You have severe abdominal cramping or pain. You have rapid weight loss or gain. You have shortness of breath with chest pain. You notice sudden or extreme swelling of your face, hands, ankles, feet, or legs. You have not felt your baby move in over an hour. You have severe headaches that do not go away with medicine. You have vision changes. Document Released: 01/22/2001 Document Revised: 02/02/2013 Document Reviewed: 03/31/2012 [facility] Patient Information 2015 [place], [place]. This information is not intended to replace advice given to you by your health care provider. Make sure you discuss any questions you have with your health care provider.

## 2024-03-18 ENCOUNTER — Other Ambulatory Visit: Payer: Self-pay | Admitting: Advanced Practice Midwife

## 2024-03-18 ENCOUNTER — Ambulatory Visit: Payer: Self-pay | Admitting: Advanced Practice Midwife

## 2024-03-18 DIAGNOSIS — E039 Hypothyroidism, unspecified: Secondary | ICD-10-CM

## 2024-03-18 LAB — CERVICOVAGINAL ANCILLARY ONLY
Bacterial Vaginitis (gardnerella): NEGATIVE
Candida Glabrata: NEGATIVE
Candida Vaginitis: NEGATIVE
Chlamydia: NEGATIVE
Comment: NEGATIVE
Comment: NEGATIVE
Comment: NEGATIVE
Comment: NEGATIVE
Comment: NEGATIVE
Comment: NORMAL
Neisseria Gonorrhea: NEGATIVE
Trichomonas: NEGATIVE

## 2024-03-18 LAB — TSH: TSH: 6.09 u[IU]/mL — ABNORMAL HIGH (ref 0.450–4.500)

## 2024-03-18 MED ORDER — LEVOTHYROXINE SODIUM 100 MCG PO TABS: 100.0000 ug | ORAL_TABLET | Freq: Every day | ORAL | 4 refills | Status: AC

## 2024-03-31 ENCOUNTER — Encounter: Admitting: Obstetrics and Gynecology

## 2024-04-14 ENCOUNTER — Encounter: Admitting: Women's Health

## 2024-05-08 ENCOUNTER — Inpatient Hospital Stay (HOSPITAL_COMMUNITY): Admit: 2024-05-08
